# Patient Record
Sex: Female | Born: 1990 | Hispanic: Yes | Smoking: Never smoker
Health system: Southern US, Community
[De-identification: ages and names within clinical notes are randomized; demographics above are authoritative.]

## PROBLEM LIST (undated history)

## (undated) ENCOUNTER — Inpatient Hospital Stay: Payer: Self-pay

## (undated) DIAGNOSIS — I10 Essential (primary) hypertension: Secondary | ICD-10-CM

## (undated) DIAGNOSIS — N611 Abscess of the breast and nipple: Secondary | ICD-10-CM

## (undated) HISTORY — DX: Abscess of the breast and nipple: N61.1

## (undated) HISTORY — DX: Essential (primary) hypertension: I10

---
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
## 2012-06-21 ENCOUNTER — Ambulatory Visit: Payer: Self-pay | Admitting: Physician Assistant

## 2012-08-01 ENCOUNTER — Observation Stay: Payer: Self-pay | Admitting: Obstetrics and Gynecology

## 2012-08-01 LAB — URINALYSIS, COMPLETE
Bilirubin,UR: NEGATIVE
Glucose,UR: NEGATIVE mg/dL (ref 0–75)
Glucose,UR: NEGATIVE mg/dL (ref 0–75)
Ketone: NEGATIVE
Nitrite: NEGATIVE
Nitrite: NEGATIVE
Ph: 7 (ref 4.5–8.0)
Protein: NEGATIVE
Protein: NEGATIVE
Specific Gravity: 1.01 (ref 1.003–1.030)
Squamous Epithelial: 14
Squamous Epithelial: 11
WBC UR: 5 /HPF (ref 0–5)

## 2012-08-01 LAB — CBC
HGB: 10.5 g/dL — ABNORMAL LOW (ref 12.0–16.0)
Platelet: 261 10*3/uL (ref 150–440)
RBC: 3.38 10*6/uL — ABNORMAL LOW (ref 3.80–5.20)

## 2012-08-02 LAB — URINE CULTURE

## 2012-08-08 ENCOUNTER — Emergency Department: Payer: Self-pay | Admitting: Emergency Medicine

## 2012-08-28 ENCOUNTER — Observation Stay: Payer: Self-pay | Admitting: Obstetrics and Gynecology

## 2012-10-16 ENCOUNTER — Inpatient Hospital Stay: Payer: Self-pay | Admitting: Obstetrics and Gynecology

## 2012-10-16 LAB — URINALYSIS, COMPLETE
Blood: NEGATIVE
Ketone: NEGATIVE
Ph: 7 (ref 4.5–8.0)
Protein: >=500
Squamous Epithelial: 51
WBC UR: 19 /HPF (ref 0–5)

## 2012-10-16 LAB — PIH PROFILE
BUN: 10 mg/dL (ref 7–18)
Calcium, Total: 8.2 mg/dL — ABNORMAL LOW (ref 8.5–10.1)
Chloride: 106 mmol/L (ref 98–107)
Co2: 24 mmol/L (ref 21–32)
Creatinine: 0.68 mg/dL (ref 0.60–1.30)
HGB: 12 g/dL (ref 12.0–16.0)
MCH: 32.8 pg (ref 26.0–34.0)
MCV: 93 fL (ref 80–100)
Osmolality: 270 (ref 275–301)
Platelet: 155 10*3/uL (ref 150–440)
RDW: 13.1 % (ref 11.5–14.5)
SGOT(AST): 21 U/L (ref 15–37)
WBC: 7.5 10*3/uL (ref 3.6–11.0)

## 2012-10-16 LAB — PROTEIN / CREATININE RATIO, URINE
Creatinine, Urine: 69.2 mg/dL (ref 30.0–125.0)
Protein/Creat. Ratio: 3584 mg/gCREAT — ABNORMAL HIGH (ref 0–200)

## 2012-10-18 LAB — URINE CULTURE

## 2012-10-18 LAB — BETA STREP CULTURE(ARMC)

## 2012-10-19 LAB — HEMATOCRIT: HCT: 29.8 % — ABNORMAL LOW (ref 35.0–47.0)

## 2012-11-27 ENCOUNTER — Emergency Department: Payer: Self-pay | Admitting: Emergency Medicine

## 2012-11-27 LAB — CBC WITH DIFFERENTIAL/PLATELET
Basophil #: 0.1 10*3/uL (ref 0.0–0.1)
Eosinophil #: 0 10*3/uL (ref 0.0–0.7)
HCT: 37.4 % (ref 35.0–47.0)
HGB: 13.2 g/dL (ref 12.0–16.0)
Lymphocyte #: 1.1 10*3/uL (ref 1.0–3.6)
Lymphocyte %: 10.3 %
MCH: 32.1 pg (ref 26.0–34.0)
Neutrophil #: 8.9 10*3/uL — ABNORMAL HIGH (ref 1.4–6.5)
Neutrophil %: 84.9 %
Platelet: 304 10*3/uL (ref 150–440)
RBC: 4.13 10*6/uL (ref 3.80–5.20)
RDW: 12.5 % (ref 11.5–14.5)

## 2012-11-27 LAB — URINALYSIS, COMPLETE
Glucose,UR: NEGATIVE mg/dL (ref 0–75)
Nitrite: NEGATIVE
RBC,UR: 1 /HPF (ref 0–5)
Specific Gravity: 1.027 (ref 1.003–1.030)
Squamous Epithelial: 54
WBC UR: 7 /HPF (ref 0–5)

## 2012-11-27 LAB — COMPREHENSIVE METABOLIC PANEL
Alkaline Phosphatase: 114 U/L (ref 50–136)
BUN: 15 mg/dL (ref 7–18)
Calcium, Total: 8.8 mg/dL (ref 8.5–10.1)
Co2: 26 mmol/L (ref 21–32)
Creatinine: 0.62 mg/dL (ref 0.60–1.30)
EGFR (Non-African Amer.): >60
Osmolality: 283 (ref 275–301)
Potassium: 3.7 mmol/L (ref 3.5–5.1)
SGOT(AST): 27 U/L (ref 15–37)
Sodium: 139 mmol/L (ref 136–145)
Total Protein: 7.4 g/dL (ref 6.4–8.2)

## 2013-07-27 HISTORY — PX: OTHER SURGICAL HISTORY: SHX169

## 2013-09-10 ENCOUNTER — Emergency Department: Payer: Self-pay

## 2013-11-01 ENCOUNTER — Ambulatory Visit: Payer: Self-pay | Admitting: Surgery

## 2014-01-22 ENCOUNTER — Emergency Department: Payer: Self-pay | Admitting: Emergency Medicine

## 2014-01-22 LAB — URINALYSIS, COMPLETE
Bacteria: NONE SEEN
Bilirubin,UR: NEGATIVE
Blood: NEGATIVE
GLUCOSE, UR: NEGATIVE mg/dL (ref 0–75)
LEUKOCYTE ESTERASE: NEGATIVE
NITRITE: NEGATIVE
PH: 6 (ref 4.5–8.0)
PROTEIN: NEGATIVE
RBC,UR: <1 /HPF (ref 0–5)
Specific Gravity: 1.027 (ref 1.003–1.030)
Squamous Epithelial: 2

## 2014-01-22 LAB — COMPREHENSIVE METABOLIC PANEL
ALK PHOS: 139 U/L — AB
Albumin: 3.9 g/dL (ref 3.4–5.0)
Anion Gap: 6 — ABNORMAL LOW (ref 7–16)
BUN: 13 mg/dL (ref 7–18)
Bilirubin,Total: 0.7 mg/dL (ref 0.2–1.0)
CO2: 26 mmol/L (ref 21–32)
Calcium, Total: 8.4 mg/dL — ABNORMAL LOW (ref 8.5–10.1)
Chloride: 104 mmol/L (ref 98–107)
Creatinine: 0.75 mg/dL (ref 0.60–1.30)
GLUCOSE: 87 mg/dL (ref 65–99)
Osmolality: 271 (ref 275–301)
Potassium: 3.8 mmol/L (ref 3.5–5.1)
SGOT(AST): 24 U/L (ref 15–37)
SGPT (ALT): 24 U/L (ref 12–78)
Sodium: 136 mmol/L (ref 136–145)
Total Protein: 7.4 g/dL (ref 6.4–8.2)

## 2014-01-22 LAB — CBC WITH DIFFERENTIAL/PLATELET
BASOS PCT: 0.3 %
Basophil #: 0 10*3/uL (ref 0.0–0.1)
Eosinophil #: 0.1 10*3/uL (ref 0.0–0.7)
Eosinophil %: 0.6 %
HCT: 41.3 % (ref 35.0–47.0)
HGB: 13.7 g/dL (ref 12.0–16.0)
Lymphocyte #: 1.2 10*3/uL (ref 1.0–3.6)
Lymphocyte %: 8.7 %
MCH: 29.9 pg (ref 26.0–34.0)
MCHC: 33.2 g/dL (ref 32.0–36.0)
MCV: 90 fL (ref 80–100)
MONO ABS: 0.7 x10 3/mm (ref 0.2–0.9)
Monocyte %: 5.3 %
Neutrophil #: 11.7 10*3/uL — ABNORMAL HIGH (ref 1.4–6.5)
Neutrophil %: 85.1 %
Platelet: 324 10*3/uL (ref 150–440)
RBC: 4.59 10*6/uL (ref 3.80–5.20)
RDW: 12.9 % (ref 11.5–14.5)
WBC: 13.7 10*3/uL — ABNORMAL HIGH (ref 3.6–11.0)

## 2014-01-22 LAB — PREGNANCY, URINE: PREGNANCY TEST, URINE: NEGATIVE m[IU]/mL

## 2014-01-22 LAB — LIPASE, BLOOD: Lipase: 129 U/L (ref 73–393)
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
## 2015-08-29 ENCOUNTER — Encounter: Payer: Self-pay | Admitting: Emergency Medicine

## 2015-08-29 ENCOUNTER — Emergency Department
Admission: EM | Admit: 2015-08-29 | Discharge: 2015-08-29 | Disposition: A | Discharge status: Home / Self Care | Payer: Medicaid Other | Attending: Emergency Medicine | Admitting: Emergency Medicine

## 2015-08-29 DIAGNOSIS — N61 Mastitis without abscess: Secondary | ICD-10-CM | POA: Diagnosis not present

## 2015-08-29 DIAGNOSIS — R509 Fever, unspecified: Secondary | ICD-10-CM | POA: Diagnosis present

## 2015-08-29 LAB — CBC WITH DIFFERENTIAL/PLATELET
Basophils Absolute: 0 10*3/uL (ref 0–0.1)
Basophils Relative: 0 %
EOS ABS: 0 10*3/uL (ref 0–0.7)
Eosinophils Relative: 0 %
HEMATOCRIT: 39.2 % (ref 35.0–47.0)
HEMOGLOBIN: 13.4 g/dL (ref 12.0–16.0)
LYMPHS ABS: 1.2 10*3/uL (ref 1.0–3.6)
Lymphocytes Relative: 7 %
MCH: 30.1 pg (ref 26.0–34.0)
MCHC: 34.3 g/dL (ref 32.0–36.0)
MCV: 87.7 fL (ref 80.0–100.0)
MONOS PCT: 6 %
Monocytes Absolute: 1 10*3/uL — ABNORMAL HIGH (ref 0.2–0.9)
NEUTROS PCT: 87 %
Neutro Abs: 15.6 10*3/uL — ABNORMAL HIGH (ref 1.4–6.5)
PLATELETS: 326 10*3/uL (ref 150–440)
RBC: 4.47 MIL/uL (ref 3.80–5.20)
RDW: 13.3 % (ref 11.5–14.5)
WBC: 18 10*3/uL — ABNORMAL HIGH (ref 3.6–11.0)

## 2015-08-29 LAB — BASIC METABOLIC PANEL
Anion gap: 7 (ref 5–15)
BUN: 11 mg/dL (ref 6–20)
CHLORIDE: 102 mmol/L (ref 101–111)
CO2: 27 mmol/L (ref 22–32)
CREATININE: 0.62 mg/dL (ref 0.44–1.00)
Calcium: 8.9 mg/dL (ref 8.9–10.3)
GFR calc non Af Amer: >60 mL/min (ref >60)
GLUCOSE: 99 mg/dL (ref 65–99)
Potassium: 3.8 mmol/L (ref 3.5–5.1)
Sodium: 136 mmol/L (ref 135–145)

## 2015-08-29 MED ORDER — CLINDAMYCIN PHOSPHATE 600 MG/50ML IV SOLN
600.0000 mg | Freq: Once | INTRAVENOUS | Status: AC
  Administered 2015-08-29: 600 mg via INTRAVENOUS
  Filled 2015-08-29: qty 50

## 2015-08-29 MED ORDER — KETOROLAC TROMETHAMINE 30 MG/ML IJ SOLN
30.0000 mg | Freq: Once | INTRAMUSCULAR | Status: AC
  Administered 2015-08-29: 30 mg via INTRAVENOUS
  Filled 2015-08-29: qty 1

## 2015-08-29 MED ORDER — CLINDAMYCIN HCL 150 MG PO CAPS: 150.0000 mg | ORAL_CAPSULE | Freq: Four times a day (QID) | ORAL | Status: DC

## 2015-08-29 MED ORDER — IBUPROFEN 800 MG PO TABS: 800.0000 mg | ORAL_TABLET | Freq: Three times a day (TID) | ORAL | Status: DC | PRN

## 2015-08-29 MED ORDER — TRAMADOL HCL 50 MG PO TABS: 50.0000 mg | ORAL_TABLET | Freq: Four times a day (QID) | ORAL | Status: DC | PRN

## 2015-08-29 MED ORDER — SODIUM CHLORIDE 0.9 % IV BOLUS (SEPSIS)
1000.0000 mL | Freq: Once | INTRAVENOUS | Status: AC
  Administered 2015-08-29: 1000 mL via INTRAVENOUS

## 2015-08-29 NOTE — ED Notes (Signed)
Body aches   Fever and possible abscess to left chest/arm

## 2015-08-29 NOTE — ED Provider Notes (Signed)
[facility] Emergency Department Provider Note  ____________________________________________  Time seen: Approximately 4:10 PM  I have reviewed the triage vital signs and the nursing notes.   HISTORY  Chief Complaint Fever    HPI Kendra Santos is a 25 y.o. female patient complaining of left breast pain and a nodule lesion that she noticed the last 3 days. Patient denies any erythema and discharge.  Denies nipple discharge. She also complaining of fever and body aches. Patient denies any URI signs symptoms. Patient denies any nausea vomiting diarrhea. No palliative measures taken for this complaint. Patient rates the pain as a 6/10.   History reviewed. No pertinent past medical history.  There are no active problems to display for this patient.   History reviewed. No pertinent past surgical history.  Current Outpatient Rx  Name  Route  Sig  Dispense  Refill  . clindamycin (CLEOCIN) 150 MG capsule   Oral   Take 1 capsule (150 mg total) by mouth 4 (four) times daily.   40 capsule   0   . ibuprofen (ADVIL,MOTRIN) 800 MG tablet   Oral   Take 1 tablet (800 mg total) by mouth every 8 (eight) hours as needed.   30 tablet   0   . traMADol (ULTRAM) 50 MG tablet   Oral   Take 1 tablet (50 mg total) by mouth every 6 (six) hours as needed.   20 tablet   0     Allergies Review of patient's allergies indicates no known allergies.  No family history on file.  Social History Social History  Substance Use Topics  . Smoking status: Never Smoker   . Smokeless tobacco: None  . Alcohol Use: No    Review of Systems Constitutional: Fever  Eyes: No visual changes. ENT: No sore throat. Cardiovascular: Denies chest pain.  Respiratory: Denies shortness of breath. Gastrointestinal: No abdominal pain.  No nausea, no vomiting.  No diarrhea.  No constipation. Genitourinary: Negative for dysuria. Musculoskeletal: Negative for back pain. Skin:  Negative for rash. Left breast pain Neurological: Negative for headaches, focal weakness or numbness. 10-point ROS otherwise negative.  ____________________________________________   PHYSICAL EXAM:  VITAL SIGNS: ED Triage Vitals  Enc Vitals Group     BP --      Pulse --      Resp --      Temp --      Temp src --      SpO2 --      Weight --      Height --      Head Cir --      Peak Flow --      Pain Score 08/29/15 1600 6     Pain Loc --      Pain Edu? --      Excl. in GC? --     Constitutional: Alert and oriented. Well appearing and in no acute distress. Eyes: Conjunctivae are normal. PERRL. EOMI. Head: Atraumatic. Nose: No congestion/rhinnorhea. Mouth/Throat: Mucous membranes are moist.  Oropharynx non-erythematous. Neck: No stridor.  No cervical spine tenderness to palpation. Hematological/Lymphatic/Immunilogical: No cervical lymphadenopathy. Cardiovascular: Normal rate, regular rhythm. Grossly normal heart sounds.  Good peripheral circulation. Tachycardic Respiratory: Normal respiratory effort.  No retractions. Lungs CTAB. Gastrointestinal: Soft and nontender. No distention. No abdominal bruits. No CVA tenderness. Musculoskeletal: No lower extremity tenderness nor edema.  No joint effusions. Neurologic:  Normal speech and language. No gross focal neurologic deficits are appreciated. No gait instability. Skin:  Skin is warm, dry and intact. No rash noted. Palpable non-erythematous nodule lesion left lateral breast. No nipple Discharge. No dimpling of breast. Moderate guarding with palpation. Psychiatric: Mood and affect are normal. Speech and behavior are normal.  ____________________________________________   LABS (all labs ordered are listed, but only abnormal results are displayed)  Labs Reviewed  CBC WITH DIFFERENTIAL/PLATELET - Abnormal; Notable for the following:    WBC 18.0 (*)    Neutro Abs 15.6 (*)    Monocytes Absolute 1.0 (*)    All other components  within normal limits  BASIC METABOLIC PANEL   ____________________________________________  EKG   ____________________________________________  RADIOLOGY   ____________________________________________   PROCEDURES  Procedure(s) performed: None  Critical Care performed: No  ____________________________________________   INITIAL IMPRESSION / ASSESSMENT AND PLAN / ED COURSE  Pertinent labs & imaging results that were available during my care of the patient were reviewed by me and considered in my medical decision making (see chart for details).  Mastitis left breast. Patient given IV clindamycin, Toradol, 1,000 cc normal saline. Patient discharge with prescription for tramadol, ibuprofen, and clindamycin. Patient given a work note for 3 days. She [facility] no improvement in 2-3 days. ____________________________________________   FINAL CLINICAL IMPRESSION(S) / ED DIAGNOSES  Final diagnoses:  Acute mastitis of left breast      Joni Reining, PA-C 08/29/15 1704  Joni Reining, PA-C 08/29/15 1744  Minna Antis, MD 08/30/15 2139

## 2015-08-29 NOTE — ED Notes (Addendum)
PA at bedside. Pt complains of knot to left breast that she noticed 3 days ago. Pt complains of fever. Knot is palpable upon assessment per PA but no redness or swelling noted to the site.

## 2015-09-18 ENCOUNTER — Other Ambulatory Visit: Payer: Self-pay | Admitting: Family Medicine

## 2015-09-19 ENCOUNTER — Other Ambulatory Visit: Payer: Self-pay | Admitting: Family Medicine

## 2015-09-19 DIAGNOSIS — N61 Mastitis without abscess: Secondary | ICD-10-CM

## 2015-09-20 ENCOUNTER — Ambulatory Visit
Admission: RE | Admit: 2015-09-20 | Discharge: 2015-09-20 | Disposition: A | Discharge status: Home / Self Care | Payer: Medicaid Other | Source: Ambulatory Visit | Attending: Family Medicine | Admitting: Family Medicine

## 2015-09-20 ENCOUNTER — Emergency Department
Admission: EM | Admit: 2015-09-20 | Discharge: 2015-09-20 | Disposition: A | Discharge status: Home / Self Care | Payer: Medicaid Other | Attending: Emergency Medicine | Admitting: Emergency Medicine

## 2015-09-20 ENCOUNTER — Encounter: Payer: Self-pay | Admitting: Emergency Medicine

## 2015-09-20 DIAGNOSIS — N61 Mastitis without abscess: Secondary | ICD-10-CM | POA: Diagnosis not present

## 2015-09-20 DIAGNOSIS — Z792 Long term (current) use of antibiotics: Secondary | ICD-10-CM | POA: Insufficient documentation

## 2015-09-20 DIAGNOSIS — N611 Abscess of the breast and nipple: Secondary | ICD-10-CM | POA: Insufficient documentation

## 2015-09-20 DIAGNOSIS — Z3202 Encounter for pregnancy test, result negative: Secondary | ICD-10-CM | POA: Insufficient documentation

## 2015-09-20 DIAGNOSIS — N644 Mastodynia: Secondary | ICD-10-CM | POA: Diagnosis present

## 2015-09-20 DIAGNOSIS — L0291 Cutaneous abscess, unspecified: Secondary | ICD-10-CM

## 2015-09-20 LAB — CBC WITH DIFFERENTIAL/PLATELET
BASOS ABS: 0.1 10*3/uL (ref 0–0.1)
BASOS PCT: 0 %
EOS ABS: 0.1 10*3/uL (ref 0–0.7)
Eosinophils Relative: 1 %
HEMATOCRIT: 42.1 % (ref 35.0–47.0)
HEMOGLOBIN: 14.2 g/dL (ref 12.0–16.0)
Lymphocytes Relative: 9 %
Lymphs Abs: 1.3 10*3/uL (ref 1.0–3.6)
MCH: 29.7 pg (ref 26.0–34.0)
MCHC: 33.8 g/dL (ref 32.0–36.0)
MCV: 87.6 fL (ref 80.0–100.0)
Monocytes Absolute: 1 10*3/uL — ABNORMAL HIGH (ref 0.2–0.9)
Monocytes Relative: 7 %
NEUTROS ABS: 12.5 10*3/uL — AB (ref 1.4–6.5)
NEUTROS PCT: 83 %
Platelets: 468 10*3/uL — ABNORMAL HIGH (ref 150–440)
RBC: 4.8 MIL/uL (ref 3.80–5.20)
RDW: 12.7 % (ref 11.5–14.5)
WBC: 14.8 10*3/uL — AB (ref 3.6–11.0)

## 2015-09-20 LAB — BASIC METABOLIC PANEL
Anion gap: 8 (ref 5–15)
BUN: 10 mg/dL (ref 6–20)
CHLORIDE: 101 mmol/L (ref 101–111)
CO2: 27 mmol/L (ref 22–32)
CREATININE: 0.73 mg/dL (ref 0.44–1.00)
Calcium: 9 mg/dL (ref 8.9–10.3)
GFR calc Af Amer: >60 mL/min (ref >60)
GFR calc non Af Amer: >60 mL/min (ref >60)
GLUCOSE: 101 mg/dL — AB (ref 65–99)
Potassium: 3.6 mmol/L (ref 3.5–5.1)
SODIUM: 136 mmol/L (ref 135–145)

## 2015-09-20 LAB — POCT PREGNANCY, URINE: Preg Test, Ur: NEGATIVE

## 2015-09-20 MED ORDER — LIDOCAINE HCL (PF) 1 % IJ SOLN
INTRAMUSCULAR | Status: AC
  Filled 2015-09-20: qty 20

## 2015-09-20 MED ORDER — ONDANSETRON HCL 4 MG/2ML IJ SOLN
4.0000 mg | Freq: Once | INTRAMUSCULAR | Status: AC
  Administered 2015-09-20: 4 mg via INTRAVENOUS

## 2015-09-20 MED ORDER — SODIUM CHLORIDE 0.9 % IV BOLUS (SEPSIS)
1000.0000 mL | Freq: Once | INTRAVENOUS | Status: AC
  Administered 2015-09-20: 1000 mL via INTRAVENOUS

## 2015-09-20 MED ORDER — VANCOMYCIN HCL IN DEXTROSE 1-5 GM/200ML-% IV SOLN
1000.0000 mg | Freq: Once | INTRAVENOUS | Status: AC
  Administered 2015-09-20: 1000 mg via INTRAVENOUS
  Filled 2015-09-20: qty 200

## 2015-09-20 MED ORDER — MORPHINE SULFATE (PF) 4 MG/ML IV SOLN
4.0000 mg | Freq: Once | INTRAVENOUS | Status: AC
  Administered 2015-09-20: 4 mg via INTRAVENOUS

## 2015-09-20 MED ORDER — VANCOMYCIN HCL IN DEXTROSE 1-5 GM/200ML-% IV SOLN
INTRAVENOUS | Status: AC
  Administered 2015-09-20: 1000 mg via INTRAVENOUS
  Filled 2015-09-20: qty 200

## 2015-09-20 MED ORDER — OXYCODONE-ACETAMINOPHEN 5-325 MG PO TABS: 1.0000 | ORAL_TABLET | ORAL | Status: DC | PRN

## 2015-09-20 MED ORDER — SULFAMETHOXAZOLE-TRIMETHOPRIM 800-160 MG PO TABS: 1.0000 | ORAL_TABLET | Freq: Two times a day (BID) | ORAL | Status: DC

## 2015-09-20 NOTE — ED Notes (Addendum)
Pt c/o left breast pain x86month, site is swollen and red. No drainage noted. Pt states she had fever x2days ago

## 2015-09-20 NOTE — ED Notes (Signed)
AAOx3.  Skin warm and dry.  NAD.  D/C home 

## 2015-09-20 NOTE — Discharge Instructions (Signed)
Please seek medical attention for any high fevers, chest pain, shortness of breath, change in behavior, persistent vomiting, bloody stool or any other new or concerning symptoms.   Absceso (Abscess)  Un absceso es una zona infectada que contiene pus y desechos.Puede aparecer en cualquier parte del cuerpo. Tambin se lo conoce como fornculo o divieso. CAUSAS  Ocurre cuando los tejidos se infectan. Tambin puede formarse por obstruccin de las glndulas sebceas o las glndulas sudorparas, infeccin de los folculos pilosos o por una lesin pequea en la piel. A medida que el organismo lucha contra la infeccin, se acumula pus en la zona y hace presin debajo de la piel. Esta presin causa dolor. Las personas con un sistema inmunolgico debilitado tienen dificultad para Industrial/product designer las infecciones y pueden formar abscesos con ms frecuencia.  SNTOMAS  Generalmente un absceso se forma sobre la piel y se vuelve una masa dolorosa, roja, caliente y sensible. Si se forma debajo de la piel, podr sentir como una zona blanda, que se [place], debajo de la piel. Algunos abscesos se abren (ruptura) por s mismos, pero la mayora seguir empeorando si no se lo trata. La infeccin puede diseminarse hacia otros sitios del cuerpo y finalmente al torrente sanguneo y hace que el enfermo se sienta mal.  DIAGNSTICO  El mdico le har una historia clnica y un examen fsico. Podrn tomarle Lauris Poag de lquido del absceso y Public librarian para Clinical research associate la causa de la infeccin. .  TRATAMIENTO  El mdico le indicar antibiticos para combatir la infeccin. Sin embargo, el uso de antibiticos solamente no curar el absceso. El mdico tendr que hacer un pequeo corte (incisin) en el absceso para drenar el pus. En algunos casos se introduce una gasa en el absceso para reducir Chief Technology Officer y que siga drenando la zona.  INSTRUCCIONES PARA EL CUIDADO EN EL HOGAR   Solo tome medicamentos de venta libre o recetados para Water quality scientist, Dentist o fiebre, segn las indicaciones del mdico.  Si le han recetado antibiticos, tmelos segn las indicaciones. Tmelos todos, aunque se sienta mejor.  Si le aplicaron una gasa, siga las indicaciones del mdico para [place].  Para evitar la propagacin de la infeccin:  Mantenga el absceso cubierto con el vendaje.  Lvese bien las manos.  No comparta artculos de cuidado personal, toallas o jacuzzis con los dems.  Evite el contacto con la piel de Economist.  Mantenga la piel y la ropa limpia alrededor del absceso.  Cumpla con todas las visitas de control, segn le indique su mdico. SOLICITE ATENCIN MDICA SI:   Aumenta el dolor, la hinchazn, el enrojecimiento, drena lquido o sangra.  Siente dolores musculares, escalofros, o una sensacin general de Dentist.  Tiene fiebre. ASEGRESE DE QUE:   Comprende estas instrucciones.  Controlar su enfermedad.  Solicitar ayuda de inmediato si no mejora o si empeora.   Esta informacin no tiene Theme park manager el consejo del mdico. Asegrese de hacerle al mdico cualquier pregunta que tenga.   Document Released: 07/13/2005 Document Revised: 01/12/2012 Elsevier Interactive Patient Education [employer]

## 2015-09-20 NOTE — H&P (Signed)
Kendra Santos is an 25 y.o. female.   Chief Complaint: Left breast abscess HPI: 25 yr old female with breast pain 2 weeks. Patient states that she was started on some antibiotics by her primary care doctor and then it was recently changed she also had ultrasound of the breast. Patient states that it just started some pain and then gradually had some redness and swelling in the area. Patient has not noticed any drainage from the breast. Patient has no other history of any breast complaints previously or any skin abscesses previously. Patient denies having anyone in the family with other skin abscesses. Patient denies any family history of breast disease or breast cancer.  Patient denies any fever chills nausea vomiting abdominal pain or dysuria.  PMHx: Hypertension  PSHx: skin lesion removed from back, unsure of pathology  Famhx: mother: hypertension and diabetes  Social History:  reports that she has never smoked. She does not have any smokeless tobacco history on file. She reports that she does not drink alcohol. Her drug history is not on file.  Allergies: No Known Allergies   (Not in a hospital admission)  Results for orders placed or performed during the hospital encounter of 09/20/15 (from the past 48 hour(s))  CBC with Differential     Status: Abnormal   Collection Time: 09/20/15  5:50 PM  Result Value Ref Range   WBC 14.8 (H) 3.6 - 11.0 K/uL   RBC 4.80 3.80 - 5.20 MIL/uL   Hemoglobin 14.2 12.0 - 16.0 g/dL   HCT 42.1 35.0 - 47.0 %   MCV 87.6 80.0 - 100.0 fL   MCH 29.7 26.0 - 34.0 pg   MCHC 33.8 32.0 - 36.0 g/dL   RDW 12.7 11.5 - 14.5 %   Platelets 468 (H) 150 - 440 K/uL   Neutrophils Relative % 83 %   Neutro Abs 12.5 (H) 1.4 - 6.5 K/uL   Lymphocytes Relative 9 %   Lymphs Abs 1.3 1.0 - 3.6 K/uL   Monocytes Relative 7 %   Monocytes Absolute 1.0 (H) 0.2 - 0.9 K/uL   Eosinophils Relative 1 %   Eosinophils Absolute 0.1 0 - 0.7 K/uL   Basophils Relative 0 %   Basophils  Absolute 0.1 0 - 0.1 K/uL  Basic metabolic panel     Status: Abnormal   Collection Time: 09/20/15  5:50 PM  Result Value Ref Range   Sodium 136 135 - 145 mmol/L   Potassium 3.6 3.5 - 5.1 mmol/L   Chloride 101 101 - 111 mmol/L   CO2 27 22 - 32 mmol/L   Glucose, Bld 101 (H) 65 - 99 mg/dL   BUN 10 6 - 20 mg/dL   Creatinine, Ser 0.73 0.44 - 1.00 mg/dL   Calcium 9.0 8.9 - 10.3 mg/dL   GFR calc non Af Amer >60 >60 mL/min   GFR calc Af Amer >60 >60 mL/min    Comment: (NOTE) The eGFR has been calculated using the CKD EPI equation. This calculation has not been validated in all clinical situations. eGFR's persistently <60 mL/min signify possible Chronic Kidney Disease.    Anion gap 8 5 - 15  Pregnancy, urine POC     Status: None   Collection Time: 09/20/15  6:12 PM  Result Value Ref Range   Preg Test, Ur NEGATIVE NEGATIVE    Comment:        THE SENSITIVITY OF THIS METHODOLOGY IS >24 mIU/mL    US Breast Ltd Uni Left Inc Axilla  09/20/2015  CLINICAL DATA:  25 year old female with increasing left breast swelling, pain and redness. Patient is now on second course of antibiotics without improvement. EXAM: ULTRASOUND OF THE LEFT BREAST COMPARISON:  None FINDINGS: On physical exam, the lateral left breast is enlarged hard and air at edematous. Targeted ultrasound is performed, showing diffuse skin thickening and parenchymal edema. There are multiple ill-defined areas of complex fluid throughout the lateral left breast compatible with infected fluid/abscesses. These areas include a 2.9 x 1.1 cm superficially at the 3 o'clock position 5 cm from the nipple, a 2.1 x 0.6 cm collection mid depth at the 2 o'clock position 5 cm from the nipple and a 1.6 x 1.1 cm collection in the mid -posterior depth 2 o'clock position 5 cm from the nipple. IMPRESSION: Left breast mastitis with multiple ill-defined areas of complex fluid throughout the lateral left breast compatible with infected fluid/abscesses as described  above. RECOMMENDATION: Clinical/surgery follow-up. These results were discussed with Marcina Millard, PA, on 09/20/2015 at 2:25 p.m., who instructed the patient to seek further treatment in the emergency room. I have discussed the findings and recommendations with the patient. Results were also provided in writing at the conclusion of the visit. If applicable, a reminder letter will be sent to the patient regarding the next appointment. BI-RADS CATEGORY  2: Benign Finding(s) Electronically Signed   By: Margarette Canada M.D.   On: 09/20/2015 14:36    Review of Systems  Constitutional: Positive for malaise/fatigue. Negative for fever and chills.  HENT: Negative for sore throat.   Respiratory: Negative for cough, shortness of breath and wheezing.   Cardiovascular: Negative for chest pain and leg swelling.  Gastrointestinal: Positive for diarrhea. Negative for nausea, vomiting, abdominal pain and blood in stool.  Genitourinary: Negative for dysuria, urgency and hematuria.  Musculoskeletal: Negative for myalgias and joint pain.  Skin: Negative for itching and rash.  Neurological: Negative for dizziness, loss of consciousness, weakness and headaches.  Psychiatric/Behavioral: Negative for depression. The patient is not nervous/anxious.   All other systems reviewed and are negative.   Blood pressure 113/75, pulse 95, temperature 98.4 F (36.9 C), temperature source Oral, resp. rate 16, height _0  (1.676 m), weight 181 lb (82.101 kg), SpO2 98 %. Physical Exam  Vitals reviewed. Constitutional: She is oriented to person, place, and time. She appears well-developed and well-nourished. No distress.  HENT:  Head: Normocephalic and atraumatic.  Right Ear: External ear normal.  Left Ear: External ear normal.  Nose: Nose normal.  Mouth/Throat: Oropharynx is clear and moist.  Eyes: Conjunctivae and EOM are normal. Pupils are equal, round, and reactive to light. No scleral icterus.  Neck: Normal range of motion.  Neck supple. No tracheal deviation present.  Cardiovascular: Normal rate, regular rhythm, normal heart sounds and intact distal pulses.  Exam reveals no gallop and no friction rub.   No murmur heard. Respiratory: Breath sounds normal. No respiratory distress. She has no wheezes. She has no rales.  GI: Soft. She exhibits no distension. There is no tenderness. There is no rebound.  Musculoskeletal: Normal range of motion. She exhibits no edema.  Neurological: She is alert and oriented to person, place, and time. No cranial nerve deficit.  Skin: Skin is warm. No rash noted. There is erythema. No pallor.  Left breast with erythema and fluctuance at 3'oclock position in about 4cm area adjacent to nipple/aerolar complex  Psychiatric: She has a normal mood and affect. Her behavior is normal. Judgment and thought content normal.  Assessment/Plan 25 year old with a left breast abscess. I personally reviewed her past medical history with some hypertension noted however she is not followed up with this. I also personally reviewed her lab values showing an elevated white blood cell count. I personally reviewed her ultrasound images showing fluid collection resembling abscess in the left breast. I also reviewed the radiology read. I discussed this with the emergency room physician Dr. Archie Balboa and offered the patient aspiration of the area with ultrasound guidance versus excision of the area. Patient opted to try the aspiration. We were able to aspirate out about 30 mL of purulent drainage from the area. I discussed with the patient that we could send her home on oral antibiotics Bactrim for a week could admit her for IV antibiotics and observation versus incision and drainage if there was not improvement. Patient opted to go home on oral antibiotics we will have her return to clinic on Monday to ensure that there is improvement.  Hubbard Robinson, MD 09/20/2015, 9:47 PM

## 2015-09-20 NOTE — ED Notes (Signed)
Pt states that she was seen here on 2/2 for abscess to left breast and took a round of antibiotics. Pt states that the knot never went away and has developed a fever and redness around site. Pt complains of pain to area. Pt saw PCP who scheduled her for a mammogram and pt had that done PTA today. Pt was told to come here afterwards for follow up due to redness.

## 2015-09-20 NOTE — ED Provider Notes (Signed)
[facility] Emergency Department Provider Note   ____________________________________________  Time seen: ~1630  I have reviewed the triage vital signs and the nursing notes.   HISTORY  Chief Complaint Abscess   History limited by: Not Limited   HPI Kendra Santos is a 25 y.o. female who presents to the emergency department today because of concerns for left breast swelling, pain and abscesses. Patient was seen earlier this month in the emergency department for left breast swelling and pain. She was put on antibiotics at that time. She states she is continuing to have pain and swelling. She saw primary care doctor 2 days ago who ordered an ultrasound. She had a ultrasound done today which did show multiple abscesses in the left breast. Because of this she was instructed to come to the emergency department. The pain is severe. It is located in the left breast and is worse whenever she moves the arm.She denies any fevers.    History reviewed. No pertinent past medical history.  There are no active problems to display for this patient.   History reviewed. No pertinent past surgical history.  Current Outpatient Rx  Name  Route  Sig  Dispense  Refill  . clindamycin (CLEOCIN) 150 MG capsule   Oral   Take 1 capsule (150 mg total) by mouth 4 (four) times daily.   40 capsule   0   . ibuprofen (ADVIL,MOTRIN) 800 MG tablet   Oral   Take 1 tablet (800 mg total) by mouth every 8 (eight) hours as needed.   30 tablet   0   . traMADol (ULTRAM) 50 MG tablet   Oral   Take 1 tablet (50 mg total) by mouth every 6 (six) hours as needed.   20 tablet   0     Allergies Review of patient's allergies indicates no known allergies.  History reviewed. No pertinent family history.  Social History Social History  Substance Use Topics  . Smoking status: Never Smoker   . Smokeless tobacco: None  . Alcohol Use: No    Review of Systems  Constitutional:  Negative for fever. Cardiovascular: Negative for chest pain. Respiratory: Negative for shortness of breath. Gastrointestinal: Negative for abdominal pain, vomiting and diarrhea. Neurological: Negative for headaches, focal weakness or numbness.   10-point ROS otherwise negative.  ____________________________________________   PHYSICAL EXAM:  VITAL SIGNS: ED Triage Vitals  Enc Vitals Group     BP 09/20/15 1453 112/99 mmHg     Pulse Rate 09/20/15 1453 101     Resp 09/20/15 1453 18     Temp 09/20/15 1453 98.4 F (36.9 C)     Temp Source 09/20/15 1453 Oral     SpO2 09/20/15 1453 97 %     Weight 09/20/15 1453 181 lb (82.101 kg)     Height 09/20/15 1453  (1.676 m)     Head Cir --      Peak Flow --      Pain Score 09/20/15 1454 9   Constitutional: Alert and oriented. Well appearing and in no distress. Eyes: Conjunctivae are normal. PERRL. Normal extraocular movements. ENT   Head: Normocephalic and atraumatic.   Nose: No congestion/rhinnorhea.   Mouth/Throat: Mucous membranes are moist.   Neck: No stridor. Hematological/Lymphatic/Immunilogical: No cervical lymphadenopathy. Cardiovascular: Normal rate, regular rhythm.  No murmurs, rubs, or gallops. Respiratory: Normal respiratory effort without tachypnea nor retractions. Breath sounds are clear and equal bilaterally. No wheezes/rales/rhonchi. Gastrointestinal: Soft and nontender. No distention. There is  no CVA tenderness. Genitourinary: Deferred Musculoskeletal: Normal range of motion in all extremities. No joint effusions.  No lower extremity tenderness nor edema. Neurologic:  Normal speech and language. No gross focal neurologic deficits are appreciated.  Skin:  Redness swelling and erythema to the left breast. Worse laterally. Induration felt. Psychiatric: Mood and affect are normal. Speech and behavior are normal. Patient exhibits appropriate insight and  judgment.  ____________________________________________    LABS (pertinent positives/negatives)  Labs Reviewed  CBC WITH DIFFERENTIAL/PLATELET - Abnormal; Notable for the following:    WBC 14.8 (*)    Platelets 468 (*)    Neutro Abs 12.5 (*)    Monocytes Absolute 1.0 (*)    All other components within normal limits  BASIC METABOLIC PANEL - Abnormal; Notable for the following:    Glucose, Bld 101 (*)    All other components within normal limits  POCT PREGNANCY, URINE     ____________________________________________   EKG  None  ____________________________________________    RADIOLOGY  US breast  IMPRESSION: Left breast mastitis with multiple ill-defined areas of complex fluid throughout the lateral left breast compatible with infected fluid/abscesses as described above.  ____________________________________________   PROCEDURES  Procedure(s) performed: Aspiration, see procedure note(s).  Critical Care performed: No  Aspiration of Abscess Performed by: Phineas Semen Consent: Verbal consent obtained. Risks and benefits: risks, benefits and alternatives were discussed Type: abscess  Body area: left breast  Anesthesia: local infiltration  Aspiration with 18 ga needle  Local anesthetic: lidocaine 1% without epinephrine  Anesthetic total: 2 ml  Drainage: purulent  Drainage amount: small  Patient tolerance: Patient tolerated the procedure well with no immediate complications.    ____________________________________________   INITIAL IMPRESSION / ASSESSMENT AND PLAN / ED COURSE  Pertinent labs & imaging results that were available during my care of the patient were reviewed by me and considered in my medical decision making (see chart for details).  Patient presented to the emergency department today because of concerns for continued left breast swelling, pain and ultrasound which showed multiple small abscesses. Patient did have a mild  leukocytosis. The patient was seen by Dr. Orvis Brill of surgery. 2 together we attempted to aspirate the abscesses with a small amount of purulent drainage. Surgery did offer to admit the patient for IV antibiotics and possible low are either patient chose to try another round of oral antibiotics. Will plan on discharging with Bactrim. Additionally patient will follow-up with Dr. Orvis Brill in her office Monday morning.  ____________________________________________   FINAL CLINICAL IMPRESSION(S) / ED DIAGNOSES  Final diagnoses:  Abscess     Phineas Semen, MD 09/20/15 2057

## 2015-09-20 NOTE — ED Notes (Signed)
Pt called out for itching, and flushed face. Pt face is red, denies any SOB or swelling. MD notified. Rate slowed

## 2015-09-23 ENCOUNTER — Encounter: Payer: Self-pay | Admitting: Surgery

## 2015-09-23 ENCOUNTER — Ambulatory Visit (INDEPENDENT_AMBULATORY_CARE_PROVIDER_SITE_OTHER): Payer: Self-pay | Admitting: Surgery

## 2015-09-23 VITALS — BP 124/71 | HR 79 | Temp 97.8°F | Ht 66.0 in | Wt 181.0 lb

## 2015-09-23 DIAGNOSIS — N611 Abscess of the breast and nipple: Secondary | ICD-10-CM

## 2015-09-23 NOTE — Patient Instructions (Signed)
Continue your antibiotics until they are completely finished.  We will see you in the [place] office on Tuesday at 3pm. We are located in the [facility], 2nd floor, [address].  If you have any increased redness, increased drainage, or fever > 100.5; call our office and speak with a nurse immediately.

## 2015-09-23 NOTE — Progress Notes (Signed)
25yr old female with left breast abscess, s/p aspiration in ED on Friday 2/24 and changed to Bactrim for antibiotics.  Patient states that she is feeling better today.  She states that the redness and pain have improved.  SHe has had some minimal bloody drainage from the needle sites but able to keep a bandaid in place.  She states that she is having a lot of diarrhea but denies any fever, chills, nausea, vomiting or abdominal pain.   Filed Vitals:   09/23/15 0817  BP: 124/71  Pulse: 79  Temp: 97.8 F (36.6 C)   PE:  Gen: NAD  Left breast: 3 o'clock position abscess about 4cm in size induration, erythema much improved now, just minmal around puncture site with some sloughing of skin in the area, tenderness much improved, no fluctuance palpable now.  Res: no distress, CTAB/L Cardio: RRR Abd: soft, nd Ext: no edema  A/P:  Will have her continue the Bactrim, instructed to continue NSAIDs and warm compresses to the area as well.  She is to call if pain increases or redness worsens.  Will have her f/u in 1 week to ensure improvement in induration and wound

## 2015-10-01 ENCOUNTER — Ambulatory Visit (INDEPENDENT_AMBULATORY_CARE_PROVIDER_SITE_OTHER): Payer: Medicaid Other | Admitting: Surgery

## 2015-10-01 ENCOUNTER — Encounter: Payer: Self-pay | Admitting: Surgery

## 2015-10-01 ENCOUNTER — Telehealth: Payer: Self-pay

## 2015-10-01 VITALS — BP 118/81 | HR 108 | Temp 98.8°F | Ht 66.0 in | Wt 172.6 lb

## 2015-10-01 DIAGNOSIS — N611 Abscess of the breast and nipple: Secondary | ICD-10-CM

## 2015-10-01 MED ORDER — SULFAMETHOXAZOLE-TRIMETHOPRIM 800-160 MG PO TABS
1.0000 | ORAL_TABLET | Freq: Two times a day (BID) | ORAL | Status: DC
Start: 2015-10-01 — End: 2018-08-08

## 2015-10-01 NOTE — Patient Instructions (Signed)
Please heat your warm compress as hot as you can stand it and place on your breast 3 times daily, while you have the compress on your breast, you will then massage this area with moderate amount of pressure to help the drainage.   We will see you back in the office next Friday.  We will also place you back on the Bactrim and this has been sent to your pharmacy.

## 2015-10-01 NOTE — Progress Notes (Signed)
25 yr old female with left breast abscess, aspirated about 2 weeks prior now.  Patient stated that it stopped draining two days prior and was getting more red and painful.  Howevere while walking in the room it started draining again.  She has been doing the warm compresses twice daily. She is on the last day of Bactrim as well.   Filed Vitals:   10/01/15 1449  BP: 118/81  Pulse: 108  Temp: 98.8 F (37.1 C)   PE:  Gen: NAD Left Breast: Induration in 8cm area from nipple to lateral position, draining from previous aspiration site, able to express manually some purulence and necrotic material, with a residual area of about 4cm  A/P:  Will have her continue warm compresses TID, restart Bactrim and f/u in 1 week, to call if she feels it is getting worse

## 2015-10-02 NOTE — Telephone Encounter (Signed)
Patient's sister called in after patient left the office and states "My [facility] because you caused her so much pain." I explained that I was in the room with the Physician at the time of the visit and we talked her sister through the pain she was having but that due to the fact that we expressed some necrotic material and purulent drainage from opening in skin, that she would have some pain after her office visit. She states that her sister had told her, " I told them that I was in pain and they held me down against my will" I did explain that the patient did state that she was in pain on multiple occasions during visit, she was given multiple breaks and we coached her through her breathing as she was very anxious but never once did patient ask the physician to stop the procedure. She states, "My sister told me that she asked for pain meds and was told that she could not have any." I informed her that I was in the room with the Physician the entire time that she was in with the patient and that I did not hear a request for pain medication given. I explained that we would have gladly given her a refill should she have said something about this. The last portion of the call, she states that the reason that her sister is now upset more than anything is because "this was supposed to be a follow-up from the Emergency Room. She wasn't coming there to have anything done and she was not prepared for this." I explained that in our surgical practice that we see patients each day that come in for follow-ups from surgical procedures or from the Emergency Room and if there is something that needs to be done during their office visit, this is taken care of with permission of the patient so that they do not incur extra charges and so that they do not leave with an increased risk of infection or pain. The patient's sister, completely dismissed everything that I had to say on the phone. She also asked that I  place her sister with a different surgeon at her next visit. I did change her appointment with another surgeon as she had asked.

## 2015-10-11 ENCOUNTER — Ambulatory Visit: Payer: Self-pay | Admitting: Surgery

## 2015-10-17 ENCOUNTER — Ambulatory Visit: Payer: Self-pay | Admitting: Surgery

## 2016-09-20 ENCOUNTER — Emergency Department
Admission: EM | Admit: 2016-09-20 | Discharge: 2016-09-20 | Disposition: A | Discharge status: Home / Self Care | Payer: Medicaid Other | Attending: Emergency Medicine | Admitting: Emergency Medicine

## 2016-09-20 DIAGNOSIS — Z79899 Other long term (current) drug therapy: Secondary | ICD-10-CM | POA: Insufficient documentation

## 2016-09-20 DIAGNOSIS — J111 Influenza due to unidentified influenza virus with other respiratory manifestations: Secondary | ICD-10-CM | POA: Diagnosis not present

## 2016-09-20 DIAGNOSIS — I10 Essential (primary) hypertension: Secondary | ICD-10-CM | POA: Diagnosis not present

## 2016-09-20 DIAGNOSIS — R05 Cough: Secondary | ICD-10-CM | POA: Diagnosis present

## 2016-09-20 LAB — URINALYSIS, ROUTINE W REFLEX MICROSCOPIC
Bilirubin Urine: NEGATIVE
Glucose, UA: NEGATIVE mg/dL
Hgb urine dipstick: NEGATIVE
Ketones, ur: 5 mg/dL — AB
Leukocytes, UA: NEGATIVE
Nitrite: NEGATIVE
Protein, ur: NEGATIVE mg/dL
Specific Gravity, Urine: 1.025 (ref 1.005–1.030)
pH: 7 (ref 5.0–8.0)

## 2016-09-20 LAB — POCT PREGNANCY, URINE: PREG TEST UR: NEGATIVE

## 2016-09-20 MED ORDER — ACETAMINOPHEN 500 MG PO TABS
1000.0000 mg | ORAL_TABLET | Freq: Once | ORAL | Status: AC
  Administered 2016-09-20: 1000 mg via ORAL
  Filled 2016-09-20: qty 2

## 2016-09-20 MED ORDER — PSEUDOEPH-BROMPHEN-DM 30-2-10 MG/5ML PO SYRP: 5.0000 mL | ORAL_SOLUTION | Freq: Four times a day (QID) | ORAL | 0 refills | Status: DC | PRN

## 2016-09-20 MED ORDER — BENZONATATE 100 MG PO CAPS: 100.0000 mg | ORAL_CAPSULE | Freq: Three times a day (TID) | ORAL | 0 refills | Status: DC | PRN

## 2016-09-20 MED ORDER — FLUTICASONE PROPIONATE 50 MCG/ACT NA SUSP: 2.0000 | Freq: Every day | NASAL | 0 refills | Status: DC

## 2016-09-20 NOTE — ED Provider Notes (Signed)
[facility] Emergency Department Provider Note ____________________________________________  Time seen: 0922  I have reviewed the triage vital signs and the nursing notes.  HISTORY  Chief Complaint  Cough and Sore Throat  HPI Kendra Santos is a 26 y.o. female presents to the ED for evaluation of 3 days of sinus congestion, fevers, diarrhea, cough and earaches. She denies taking anything other than Nyquil for symptoms. She dose Tylenol twice yesterday. She did not receive the flu vaccine for the season.   Past Medical History:  Diagnosis Date  . Breast abscess   . Hypertension     Patient Active Problem List   Diagnosis Date Noted  . Left breast abscess     Past Surgical History:  Procedure Laterality Date  . sebaceous cyst excision  2015   Back    Prior to Admission medications   Medication Sig Start Date End Date Taking? Authorizing Provider  benzonatate (TESSALON PERLES) 100 MG capsule Take 1 capsule (100 mg total) by mouth 3 (three) times daily as needed for cough (Take 1-2 per dose). 09/20/16   Kensy Blizard V Bacon Treshawn Allen, PA-C  brompheniramine-pseudoephedrine-DM 30-2-10 MG/5ML syrup Take 5 mLs by mouth 4 (four) times daily as needed. 09/20/16   Kaela Beitz V Bacon Starlett Pehrson, PA-C  fluticasone (FLONASE) 50 MCG/ACT nasal spray Place 2 sprays into both nostrils daily. 09/20/16   Ireoluwa Grant V Bacon Javen Hinderliter, PA-C  sulfamethoxazole-trimethoprim (BACTRIM DS,SEPTRA DS) 800-160 MG tablet Take 1 tablet by mouth 2 (two) times daily. 10/01/15   Gladis Riffle, MD    Allergies Patient has no known allergies.  Family History  Problem Relation Age of Onset  . Diabetes Mother     Social History Social History  Substance Use Topics  . Smoking status: Never Smoker  . Smokeless tobacco: Never Used  . Alcohol use No    Review of Systems  Constitutional: Negative for fever. Eyes: Negative for visual changes. ENT: Negative for sore throat.  Cardiovascular:  Negative for chest pain. Respiratory: Negative for shortness of breath. Reports cough.  Gastrointestinal: Negative for abdominal pain, vomiting. Reports diarrhea. Genitourinary: Negative for dysuria. Musculoskeletal: Negative for back pain. Reports bodyaches Skin: Negative for rash. Neurological: Negative for headaches, focal weakness or numbness. ____________________________________________  PHYSICAL EXAM:  VITAL SIGNS: ED Triage Vitals  Enc Vitals Group     BP 09/20/16 0858 120/80     Pulse Rate 09/20/16 0858 (!) 104     Resp 09/20/16 0858 18     Temp 09/20/16 0858 (!) 101.7 F (38.7 C)     Temp Source 09/20/16 0858 Oral     SpO2 09/20/16 0858 97 %     Weight 09/20/16 0858 196 lb (88.9 kg)     Height 09/20/16 0858 5\' 3"  (1.6 m)     Head Circumference --      Peak Flow --      Pain Score 09/20/16 0902 8     Pain Loc --      Pain Edu? --      Excl. in GC? --    Constitutional: Alert and oriented. Well appearing and in no distress. Head: Normocephalic and atraumatic. Eyes: Conjunctivae are normal. PERRL. Normal extraocular movements Ears: Canals clear. TMs intact bilaterally. Nose: No congestion/rhinorrhea/epistaxis. Mouth/Throat: Mucous membranes are moist. Neck: Supple. No thyromegaly. Hematological/Lymphatic/Immunological: No cervical lymphadenopathy. Cardiovascular: Normal rate, regular rhythm. Normal distal pulses. Respiratory: Normal respiratory effort. No wheezes/rales/rhonchi. Gastrointestinal: Soft and nontender. No distention. Musculoskeletal: Nontender with normal range of motion in all  extremities.  Neurologic:  Normal gait without ataxia. Normal speech and language. No gross focal neurologic deficits are appreciated. Skin:  Skin is warm, dry and intact. No rash noted. ____________________________________________   LABS (pertinent positives/negatives) Labs Reviewed  URINALYSIS, ROUTINE W REFLEX MICROSCOPIC - Abnormal; Notable for the following:       Result  Value   Color, Urine YELLOW (*)    APPearance CLEAR (*)    Ketones, ur 5 (*)    All other components within normal limits  POC URINE PREG, ED  POCT PREGNANCY, URINE  ____________________________________________  PROCEDURES  Tylenol 1000 mg PO ____________________________________________  INITIAL IMPRESSION / ASSESSMENT AND PLAN / ED COURSE  Patient with clinical presentation consistent with influenza. She is discharged with a prescription for Tessalon Perles, Flonase, and Bromphed-DM for symptom relief. She will continue to monitor and treat fevers as needed. [facility].  ____________________________________________  FINAL CLINICAL IMPRESSION(S) / ED DIAGNOSES  Final diagnoses:  Influenza      Lissa HoardJenise V Bacon Madeline Bebout, PA-C 09/20/16 1115    Myrna Blazeravid Matthew Schaevitz, MD 09/20/16 [phone]

## 2016-09-20 NOTE — Discharge Instructions (Signed)
Your symptoms are consistent with influenza. You should dose medicines to treat symptoms as discussed. Take the prescription meds as directed. Rest, hydrate, and treat fevers with Tylenol and Motrin.

## 2016-09-20 NOTE — ED Triage Notes (Signed)
Pt c/o cough with sinus congestion, sore throat and earache for the past 3 days.

## 2017-08-29 ENCOUNTER — Emergency Department: Payer: Medicaid Other

## 2017-08-29 ENCOUNTER — Emergency Department
Admission: EM | Admit: 2017-08-29 | Discharge: 2017-08-29 | Disposition: A | Discharge status: Home / Self Care | Payer: Medicaid Other | Attending: Emergency Medicine | Admitting: Emergency Medicine

## 2017-08-29 ENCOUNTER — Other Ambulatory Visit: Payer: Self-pay

## 2017-08-29 ENCOUNTER — Encounter: Payer: Self-pay | Admitting: Emergency Medicine

## 2017-08-29 DIAGNOSIS — R197 Diarrhea, unspecified: Secondary | ICD-10-CM | POA: Insufficient documentation

## 2017-08-29 DIAGNOSIS — I10 Essential (primary) hypertension: Secondary | ICD-10-CM | POA: Diagnosis not present

## 2017-08-29 DIAGNOSIS — R112 Nausea with vomiting, unspecified: Secondary | ICD-10-CM | POA: Diagnosis not present

## 2017-08-29 DIAGNOSIS — Z79899 Other long term (current) drug therapy: Secondary | ICD-10-CM | POA: Diagnosis not present

## 2017-08-29 DIAGNOSIS — R109 Unspecified abdominal pain: Secondary | ICD-10-CM | POA: Diagnosis present

## 2017-08-29 LAB — COMPREHENSIVE METABOLIC PANEL
ALT: 25 U/L (ref 14–54)
ANION GAP: 10 (ref 5–15)
AST: 30 U/L (ref 15–41)
Albumin: 4.8 g/dL (ref 3.5–5.0)
Alkaline Phosphatase: 115 U/L (ref 38–126)
BUN: 10 mg/dL (ref 6–20)
CALCIUM: 8.9 mg/dL (ref 8.9–10.3)
CHLORIDE: 103 mmol/L (ref 101–111)
CO2: 21 mmol/L — ABNORMAL LOW (ref 22–32)
CREATININE: 0.64 mg/dL (ref 0.44–1.00)
GFR calc non Af Amer: >60 mL/min (ref >60)
Glucose, Bld: 117 mg/dL — ABNORMAL HIGH (ref 65–99)
Potassium: 4.2 mmol/L (ref 3.5–5.1)
SODIUM: 134 mmol/L — AB (ref 135–145)
Total Bilirubin: 0.6 mg/dL (ref 0.3–1.2)
Total Protein: 8.2 g/dL — ABNORMAL HIGH (ref 6.5–8.1)

## 2017-08-29 LAB — URINALYSIS, COMPLETE (UACMP) WITH MICROSCOPIC
Bilirubin Urine: NEGATIVE
GLUCOSE, UA: NEGATIVE mg/dL
HGB URINE DIPSTICK: NEGATIVE
KETONES UR: NEGATIVE mg/dL
LEUKOCYTES UA: NEGATIVE
Nitrite: NEGATIVE
PH: 7 (ref 5.0–8.0)
Protein, ur: 30 mg/dL — AB
Specific Gravity, Urine: 1.027 (ref 1.005–1.030)

## 2017-08-29 LAB — LIPASE, BLOOD: Lipase: 27 U/L (ref 11–51)

## 2017-08-29 LAB — CBC
HCT: 43.9 % (ref 35.0–47.0)
HEMOGLOBIN: 14.8 g/dL (ref 12.0–16.0)
MCH: 30 pg (ref 26.0–34.0)
MCHC: 33.8 g/dL (ref 32.0–36.0)
MCV: 88.7 fL (ref 80.0–100.0)
PLATELETS: 347 10*3/uL (ref 150–440)
RBC: 4.95 MIL/uL (ref 3.80–5.20)
RDW: 12.8 % (ref 11.5–14.5)
WBC: 24.2 10*3/uL — ABNORMAL HIGH (ref 3.6–11.0)

## 2017-08-29 LAB — POCT PREGNANCY, URINE: Preg Test, Ur: NEGATIVE

## 2017-08-29 MED ORDER — ONDANSETRON HCL 4 MG/2ML IJ SOLN
4.0000 mg | Freq: Once | INTRAMUSCULAR | Status: AC
  Administered 2017-08-29: 4 mg via INTRAVENOUS
  Filled 2017-08-29: qty 2

## 2017-08-29 MED ORDER — LOPERAMIDE HCL 2 MG PO CAPS
2.0000 mg | ORAL_CAPSULE | Freq: Once | ORAL | Status: AC
Start: 2017-08-29 — End: 2017-08-29
  Administered 2017-08-29: 2 mg via ORAL
  Filled 2017-08-29: qty 1

## 2017-08-29 MED ORDER — MORPHINE SULFATE (PF) 2 MG/ML IV SOLN
2.0000 mg | Freq: Once | INTRAVENOUS | Status: AC
  Administered 2017-08-29: 2 mg via INTRAVENOUS
  Filled 2017-08-29: qty 1

## 2017-08-29 MED ORDER — IOPAMIDOL (ISOVUE-300) INJECTION 61%
100.0000 mL | Freq: Once | INTRAVENOUS | Status: AC | PRN
  Administered 2017-08-29: 100 mL via INTRAVENOUS

## 2017-08-29 MED ORDER — ONDANSETRON 4 MG PO TBDP
4.0000 mg | ORAL_TABLET | Freq: Once | ORAL | Status: AC | PRN
  Administered 2017-08-29: 4 mg via ORAL
  Filled 2017-08-29: qty 1

## 2017-08-29 MED ORDER — SODIUM CHLORIDE 0.9 % IV BOLUS (SEPSIS)
1000.0000 mL | Freq: Once | INTRAVENOUS | Status: AC
  Administered 2017-08-29: 1000 mL via INTRAVENOUS

## 2017-08-29 MED ORDER — ONDANSETRON 4 MG PO TBDP: 4.0000 mg | ORAL_TABLET | Freq: Four times a day (QID) | ORAL | 0 refills | Status: DC | PRN

## 2017-08-29 NOTE — Discharge Instructions (Signed)
? ?  Please return to the emergency room right away if you are to develop a fever, severe nausea, your pain becomes severe or worsens, you are unable to keep food down, begin vomiting any dark or bloody fluid, you develop any dark or bloody stools, feel dehydrated, or other new concerns or symptoms arise. ? ?

## 2017-08-29 NOTE — ED Provider Notes (Signed)
[facility] Emergency Department Provider Note  ____________________________________________   First MD Initiated Contact with Patient 08/29/17 1153     (approximate)  I have reviewed the triage vital signs and the nursing notes.   HISTORY  Chief Complaint Abdominal Pain; Emesis; and Diarrhea    HPI Carlisle BeersSereida Delia Headyrias Sanchez is a 27 y.o. female here for evaluation of nausea vomiting diarrhea  Patient reports that she had dinner with her husband last night, and about 5 in the morning she suddenly started feeling nauseated and began vomiting reports she is vomiting of food water and acid.  She also then began having loose diarrhea and crampy stomach pain.  She has had ongoing nausea and vomiting intermittently for the last several hours.  Her husband also began experiencing similar symptoms and vomited a couple of times earlier but this seems to be improving.  No fevers or chills.  Denies any lower abdominal pain or pelvic pain.  Reports mostly crampy discomfort in the mid abdomen that comes and goes.  Currently reports only minimal discomfort, but ongoing nausea and reports she has had 2 loose stools in the ER.  No history of travel.  Does report her husband with the same symptoms occurring approximately the same time this morning    Past Medical History:  Diagnosis Date  . Breast abscess   . Hypertension     Patient Active Problem List   Diagnosis Date Noted  . Left breast abscess     Past Surgical History:  Procedure Laterality Date  . sebaceous cyst excision  2015   Back    Prior to Admission medications   Medication Sig Start Date End Date Taking? Authorizing Provider  benzonatate (TESSALON PERLES) 100 MG capsule Take 1 capsule (100 mg total) by mouth 3 (three) times daily as needed for cough (Take 1-2 per dose). 09/20/16   Menshew, Charlesetta IvoryJenise V Bacon, PA-C  brompheniramine-pseudoephedrine-DM 30-2-10 MG/5ML syrup Take 5 mLs by mouth 4 (four) times  daily as needed. 09/20/16   Menshew, Charlesetta IvoryJenise V Bacon, PA-C  fluticasone (FLONASE) 50 MCG/ACT nasal spray Place 2 sprays into both nostrils daily. 09/20/16   Menshew, Charlesetta IvoryJenise V Bacon, PA-C  ondansetron (ZOFRAN ODT) 4 MG disintegrating tablet Take 1 tablet (4 mg total) by mouth every 6 (six) hours as needed for nausea or vomiting. 08/29/17   Sharyn CreamerQuale, Opel Lejeune, MD  sulfamethoxazole-trimethoprim (BACTRIM DS,SEPTRA DS) 800-160 MG tablet Take 1 tablet by mouth 2 (two) times daily. 10/01/15   Gladis RiffleLoflin, Catherine L, MD    Allergies Patient has no known allergies.  Family History  Problem Relation Age of Onset  . Diabetes Mother     Social History Social History   Tobacco Use  . Smoking status: Never Smoker  . Smokeless tobacco: Never Used  Substance Use Topics  . Alcohol use: No  . Drug use: No    Review of Systems Constitutional: No fever/chills Eyes: No visual changes. ENT: No sore throat. Cardiovascular: Denies chest pain. Respiratory: Denies shortness of breath. Gastrointestinal: No black or bloody stool.  No constipation. Genitourinary: Negative for dysuria.  Denies pregnancy. Musculoskeletal: Negative for back pain. Skin: Negative for rash. Neurological: Negative for headaches, focal weakness or numbness.    ____________________________________________   PHYSICAL EXAM:  VITAL SIGNS: ED Triage Vitals  Enc Vitals Group     BP 08/29/17 1115 114/71     Pulse Rate 08/29/17 1115 79     Resp 08/29/17 1115 16     Temp 08/29/17 1115 98.7  F (37.1 C)     Temp Source 08/29/17 1115 Oral     SpO2 08/29/17 1115 96 %     Weight 08/29/17 1116 170 lb (77.1 kg)     Height 08/29/17 1116 5\' 1"  (1.549 m)     Head Circumference --      Peak Flow --      Pain Score 08/29/17 1116 9     Pain Loc --      Pain Edu? --      Excl. in GC? --     Constitutional: Alert and oriented. Well appearing and in no acute distress. Eyes: Conjunctivae are normal. Head: Atraumatic. Nose: No  congestion/rhinnorhea. Mouth/Throat: Mucous membranes are moist. Neck: No stridor.   Cardiovascular: Normal rate, regular rhythm. Grossly normal heart sounds.  Good peripheral circulation. Respiratory: Normal respiratory effort.  No retractions. Lungs CTAB. Gastrointestinal: Soft, mild tenderness in the left upper quadrant.  No rebound or guarding.  Negative Murphy.  No pain to McBurney's point.  No rebound or guarding in any quadrant.  Denies pain in the right lower quadrant. Musculoskeletal: No lower extremity tenderness nor edema. Neurologic:  Normal speech and language. No gross focal neurologic deficits are appreciated.  Skin:  Skin is warm, dry and intact. No rash noted. Psychiatric: Mood and affect are normal. Speech and behavior are normal.  ____________________________________________   LABS (all labs ordered are listed, but only abnormal results are displayed)  Labs Reviewed  COMPREHENSIVE METABOLIC PANEL - Abnormal; Notable for the following components:      Result Value   Sodium 134 (*)    CO2 21 (*)    Glucose, Bld 117 (*)    Total Protein 8.2 (*)    All other components within normal limits  CBC - Abnormal; Notable for the following components:   WBC 24.2 (*)    All other components within normal limits  URINALYSIS, COMPLETE (UACMP) WITH MICROSCOPIC - Abnormal; Notable for the following components:   Color, Urine AMBER (*)    APPearance HAZY (*)    Protein, ur 30 (*)    Bacteria, UA RARE (*)    Squamous Epithelial / LPF 0-5 (*)    All other components within normal limits  GASTROINTESTINAL PANEL BY PCR, STOOL (REPLACES STOOL CULTURE)  LIPASE, BLOOD  POC URINE PREG, ED  POCT PREGNANCY, URINE   ____________________________________________  EKG   ____________________________________________  RADIOLOGY  CT abdomen pelvis, negative for acute reviewed by me ____________________________________________   PROCEDURES  Procedure(s) performed:  None  Procedures  Critical Care performed: No  ____________________________________________   INITIAL IMPRESSION / ASSESSMENT AND PLAN / ED COURSE  Pertinent labs & imaging results that were available during my care of the patient were reviewed by me and considered in my medical decision making (see chart for details).  Differential diagnosis includes but is not limited to, abdominal perforation, aortic dissection, cholecystitis, appendicitis, diverticulitis, colitis, esophagitis/gastritis, kidney stone, pyelonephritis, urinary tract infection, aortic aneurysm. All are considered in decision and treatment plan. Based upon the patient's presentation and risk factors, and negative pregnancy status will proceed with CT scan.  Though her symptoms sound most consistent with likely self-limited gastrointestinal disease, her white blood cell count of 24,000 is noted, prompting me to order a CT scan to further evaluate for etiology.  ----------------------------------------- 2:14 PM on 08/29/2017 -----------------------------------------  Patient feels much better.  No ongoing diarrhea or vomiting.  She reports she feels well, no ongoing pain or discomfort.  The patient appears appropriate  for ongoing outpatient therapy, careful abdominal pain precautions discussed.  Patient in no distress, appears appropriate for ongoing outpatient management and encouraged her to follow-up with her primary doctor in 1-2 days.  Return precautions and treatment recommendations and follow-up discussed with the patient who is agreeable with the plan.        ____________________________________________   FINAL CLINICAL IMPRESSION(S) / ED DIAGNOSES  Final diagnoses:  Nausea vomiting and diarrhea      NEW MEDICATIONS STARTED DURING THIS VISIT:  New Prescriptions   ONDANSETRON (ZOFRAN ODT) 4 MG DISINTEGRATING TABLET    Take 1 tablet (4 mg total) by mouth every 6 (six) hours as needed for nausea or  vomiting.     Note:  This document was prepared using Dragon voice recognition software and may include unintentional dictation errors.     Sharyn Creamer, MD 08/29/17 1415

## 2017-08-29 NOTE — ED Notes (Signed)
Pt returned from CT °

## 2017-08-29 NOTE — ED Triage Notes (Signed)
Pt reports RUQ abdominal pain since 0500 reports several episodes and diarrhea. Pt reports last episode of vomiting 30 minutes ago and diarrhea prior to arrival. Pt denies any other symptoms denies any shortness of breath denies any chest pain.

## 2017-08-29 NOTE — ED Notes (Addendum)
Pt states started with sharp lower abdominal pain around 5am. Started with N&V&D. Alert, oriented. Still has abdominal organs. States ODT zofran helped "a little bit"

## 2017-08-29 NOTE — ED Notes (Signed)
Pt reports she took antidiarrhea medications around 0700

## 2017-08-29 NOTE — ED Notes (Signed)
First nurse note-here for NVD since last night. Alert, reports cannot keep anything down. NAD

## 2017-08-29 NOTE — ED Notes (Signed)
Pt taken to CT.

## 2018-03-08 ENCOUNTER — Emergency Department
Admission: EM | Admit: 2018-03-08 | Discharge: 2018-03-08 | Disposition: A | Discharge status: Home / Self Care | Payer: Medicaid Other | Attending: Student in an Organized Health Care Education/Training Program | Admitting: Student in an Organized Health Care Education/Training Program

## 2018-03-08 ENCOUNTER — Encounter: Payer: Self-pay | Admitting: Emergency Medicine

## 2018-03-08 DIAGNOSIS — I10 Essential (primary) hypertension: Secondary | ICD-10-CM | POA: Insufficient documentation

## 2018-03-08 DIAGNOSIS — O9989 Other specified diseases and conditions complicating pregnancy, childbirth and the puerperium: Secondary | ICD-10-CM | POA: Insufficient documentation

## 2018-03-08 DIAGNOSIS — M5441 Lumbago with sciatica, right side: Secondary | ICD-10-CM | POA: Diagnosis not present

## 2018-03-08 DIAGNOSIS — Z3A00 Weeks of gestation of pregnancy not specified: Secondary | ICD-10-CM | POA: Diagnosis not present

## 2018-03-08 LAB — COMPREHENSIVE METABOLIC PANEL
ALBUMIN: 3.6 g/dL (ref 3.5–5.0)
ALT: 21 U/L (ref 0–44)
ANION GAP: 6 (ref 5–15)
AST: 19 U/L (ref 15–41)
Alkaline Phosphatase: 53 U/L (ref 38–126)
BUN: 6 mg/dL (ref 6–20)
CHLORIDE: 107 mmol/L (ref 98–111)
CO2: 23 mmol/L (ref 22–32)
Calcium: 8.5 mg/dL — ABNORMAL LOW (ref 8.9–10.3)
Creatinine, Ser: 0.37 mg/dL — ABNORMAL LOW (ref 0.44–1.00)
GFR calc Af Amer: >60 mL/min (ref >60)
GFR calc non Af Amer: >60 mL/min (ref >60)
GLUCOSE: 106 mg/dL — AB (ref 70–99)
POTASSIUM: 3.8 mmol/L (ref 3.5–5.1)
Sodium: 136 mmol/L (ref 135–145)
Total Bilirubin: 0.5 mg/dL (ref 0.3–1.2)
Total Protein: 6.5 g/dL (ref 6.5–8.1)

## 2018-03-08 LAB — CBC
HCT: 34.5 % — ABNORMAL LOW (ref 35.0–47.0)
HEMOGLOBIN: 12.2 g/dL (ref 12.0–16.0)
MCH: 31.7 pg (ref 26.0–34.0)
MCHC: 35.4 g/dL (ref 32.0–36.0)
MCV: 89.5 fL (ref 80.0–100.0)
PLATELETS: 300 10*3/uL (ref 150–440)
RBC: 3.86 MIL/uL (ref 3.80–5.20)
RDW: 13.1 % (ref 11.5–14.5)
WBC: 7.7 10*3/uL (ref 3.6–11.0)

## 2018-03-08 LAB — URINALYSIS, COMPLETE (UACMP) WITH MICROSCOPIC
Bacteria, UA: NONE SEEN
Bilirubin Urine: NEGATIVE
GLUCOSE, UA: NEGATIVE mg/dL
HGB URINE DIPSTICK: NEGATIVE
Ketones, ur: NEGATIVE mg/dL
Leukocytes, UA: NEGATIVE
NITRITE: NEGATIVE
PROTEIN: NEGATIVE mg/dL
Specific Gravity, Urine: 1.003 — ABNORMAL LOW (ref 1.005–1.030)
WBC, UA: NONE SEEN WBC/hpf (ref 0–5)
pH: 7 (ref 5.0–8.0)

## 2018-03-08 LAB — POCT PREGNANCY, URINE: Preg Test, Ur: POSITIVE — AB

## 2018-03-08 LAB — LIPASE, BLOOD: LIPASE: 34 U/L (ref 11–51)

## 2018-03-08 MED ORDER — BUPIVACAINE HCL (PF) 0.5 % IJ SOLN: 30.0000 mL | Freq: Once | INTRAMUSCULAR | Status: DC

## 2018-03-08 MED ORDER — ACETAMINOPHEN 500 MG PO TABS
1000.0000 mg | ORAL_TABLET | Freq: Once | ORAL | Status: AC
  Administered 2018-03-08: 1000 mg via ORAL
  Filled 2018-03-08: qty 2

## 2018-03-08 NOTE — Discharge Instructions (Addendum)
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
## 2018-08-08 ENCOUNTER — Other Ambulatory Visit: Payer: Self-pay

## 2018-08-08 ENCOUNTER — Observation Stay
Admission: EM | Admit: 2018-08-08 | Discharge: 2018-08-08 | Disposition: A | Discharge status: Home / Self Care | Payer: Medicaid Other | Attending: Obstetrics & Gynecology | Admitting: Obstetrics & Gynecology

## 2018-08-08 DIAGNOSIS — Z3A35 35 weeks gestation of pregnancy: Secondary | ICD-10-CM

## 2018-08-08 DIAGNOSIS — O10913 Unspecified pre-existing hypertension complicating pregnancy, third trimester: Secondary | ICD-10-CM | POA: Insufficient documentation

## 2018-08-08 DIAGNOSIS — O4703 False labor before 37 completed weeks of gestation, third trimester: Secondary | ICD-10-CM | POA: Diagnosis not present

## 2018-08-08 LAB — WET PREP, GENITAL
Clue Cells Wet Prep HPF POC: NONE SEEN
Sperm: NONE SEEN
Trich, Wet Prep: NONE SEEN
YEAST WET PREP: NONE SEEN

## 2018-08-08 LAB — URINALYSIS, COMPLETE (UACMP) WITH MICROSCOPIC
Bilirubin Urine: NEGATIVE
GLUCOSE, UA: NEGATIVE mg/dL
Hgb urine dipstick: NEGATIVE
Ketones, ur: 5 mg/dL — AB
Leukocytes, UA: NEGATIVE
Nitrite: NEGATIVE
Protein, ur: NEGATIVE mg/dL
Specific Gravity, Urine: 1.011 (ref 1.005–1.030)
pH: 6 (ref 5.0–8.0)

## 2018-08-08 LAB — CHLAMYDIA/NGC RT PCR (ARMC ONLY)
Chlamydia Tr: NOT DETECTED
N gonorrhoeae: NOT DETECTED

## 2018-08-08 MED ORDER — ACETAMINOPHEN 325 MG PO TABS: 650.0000 mg | ORAL_TABLET | ORAL | Status: DC | PRN

## 2018-08-08 NOTE — Final Progress Note (Signed)
Physician Final Progress Note  Patient ID: Basil DessSereida Gandolfo Sanchez MRN: [id number] DOB/AGE: March 11, 1991 28 y.o.  Admit date: 08/08/2018 Admitting provider: Nadara Mustardobert P Harris, MD Discharge date: 08/08/2018   Admission Diagnoses: Vaginal discharge, irregular contractions  Discharge Diagnoses: Irregular uterine contractions at term  History of Present Illness: The patient is a 28 y.o. female G2P1001 at 7182w3d seen at [facility] who presents for occasional contractions since 0300 this morning and an increase in discharge over the past few days that has ranged in color from yellowish to brownish to pink tinged. She rates her pain as 4/10. Nothing has made it better or worse. She has not had any significant amount of vaginal bleeding although she has seen a pink tinge to the discharge once when wiping. She has not been leaking fluid. Her baby is moving well.  Review of Systems: Review of systems negative unless otherwise noted in HPI.   Past Medical History:  Diagnosis Date  . Breast abscess   . Hypertension     Past Surgical History:  Procedure Laterality Date  . sebaceous cyst excision  2015   Back    No current facility-administered medications on file prior to encounter.    No current outpatient medications on file prior to encounter.    No Known Allergies  Social History   Socioeconomic History  . Marital status: Married    Spouse name: Not on file  . Number of children: Not on file  . Years of education: Not on file  . Highest education level: Not on file  Occupational History  . Not on file  Social Needs  . Financial resource strain: Not on file  . Food insecurity:    Worry: Not on file    Inability: Not on file  . Transportation needs:    Medical: Not on file    Non-medical: Not on file  Tobacco Use  . Smoking status: Never Smoker  . Smokeless tobacco: Never Used  Substance and Sexual Activity  . Alcohol use: No  . Drug use: No  . Sexual activity: Not on file   Lifestyle  . Physical activity:    Days per week: Not on file    Minutes per session: Not on file  . Stress: Not on file  Relationships  . Social connections:    Talks on phone: Not on file    Gets together: Not on file    Attends religious service: Not on file    Active member of club or organization: Not on file    Attends meetings of clubs or organizations: Not on file    Relationship status: Not on file  . Intimate partner violence:    Fear of current or ex partner: Not on file    Emotionally abused: Not on file    Physically abused: Not on file    Forced sexual activity: Not on file  Other Topics Concern  . Not on file  Social History Narrative  . Not on file    Family history: Family History  Problem Relation Age of Onset  . Diabetes Mother     Physical Exam: BP 128/80 (BP Location: Right Arm)   Pulse 84   Temp 98.2 F (36.8 C) (Oral)   Resp 18   Ht 5\' 1"  (1.549 m)   Wt 78 kg   LMP 12/03/2017   BMI 32.50 kg/m   Gen: NAD CV: Regular rate Pulm: No increased work of breathing Pelvic: 1/20/-1, unchanged on re-examination after two hours of  observation SSE showed small amount of whitish discharge, no pooling, friable cervix when collecting samples. Ferning negative on microscopy. Ext: No signs of DVT  NST Baseline: 140 Variability: moderate Accelerations: present Decelerations: absent Tocometry: irregular, every 3-10 minutes The patient was monitored for >60 minutes, fetal heart rate tracing was deemed reactive.  Consults: None  Significant Findings/ Diagnostic Studies: labs: negative wet prep, UA, and GC/chlamydia  Procedures: NST, SSE  Discharge Condition: good  Disposition: Discharge disposition: 01-Home or Self Care       Diet: Regular diet  Discharge Activity: Activity as tolerated  Discharge Instructions    Discharge activity:  No Restrictions   Complete by:  As directed    Discharge diet:  No restrictions   Complete by:  As  directed    Fetal Kick Count:  Lie on our left side for one hour after a meal, and count the number of times your baby kicks.  If it is less than 5 times, get up, move around and drink some juice.  Repeat the test 30 minutes later.  If it is still less than 5 kicks in an hour, notify your doctor.   Complete by:  As directed    LABOR:  When conractions begin, you should start to time them from the beginning of one contraction to the beginning  of the next.  When contractions are 5 - 10 minutes apart or less and have been regular for at least an hour, you should call your health care provider.   Complete by:  As directed    No sexual activity restrictions   Complete by:  As directed    Notify physician for bleeding from the vagina   Complete by:  As directed    Notify physician for blurring of vision or spots before the eyes   Complete by:  As directed    Notify physician for chills or fever   Complete by:  As directed    Notify physician for fainting spells, "black outs" or loss of consciousness   Complete by:  As directed    Notify physician for increase in vaginal discharge   Complete by:  As directed    Notify physician for leaking of fluid   Complete by:  As directed    Notify physician for pain or burning when urinating   Complete by:  As directed    Notify physician for pelvic pressure (sudden increase)   Complete by:  As directed    Notify physician for severe or continued nausea or vomiting   Complete by:  As directed    Notify physician for sudden gushing of fluid from the vagina (with or without continued leaking)   Complete by:  As directed    Notify physician for sudden, constant, or occasional abdominal pain   Complete by:  As directed    Notify physician if baby moving less than usual   Complete by:  As directed      Allergies as of 08/08/2018   No Known Allergies     Medication List    STOP taking these medications   benzonatate 100 MG capsule Commonly known as:   TESSALON PERLES   brompheniramine-pseudoephedrine-DM 30-2-10 MG/5ML syrup   fluticasone 50 MCG/ACT nasal spray Commonly known as:  FLONASE   ondansetron 4 MG disintegrating tablet Commonly known as:  ZOFRAN ODT   sulfamethoxazole-trimethoprim 800-160 MG tablet Commonly known as:  BACTRIM DS,SEPTRA DS      Labor precautions emphasized.  Signed: Rosalyn Gess  Bridgette Habermann, CNM  08/08/2018

## 2018-08-08 NOTE — Discharge Summary (Signed)
See Final Progress Note 08/08/2018. 

## 2018-08-08 NOTE — OB Triage Note (Signed)
Pt G2P1 presents at [redacted]w[redacted]d from ED with c/o yellow/brown and clear discharge with some pink tinge that started yesterday. Pt states it has been small amount. Pt also reports lower abd pain that started around 3am and rates 4/10. Reports +FM. Pt is seen at the [facility]. VSS. Monitors applied. Will continue to monitor

## 2018-10-09 ENCOUNTER — Other Ambulatory Visit: Payer: Self-pay

## 2018-10-09 ENCOUNTER — Emergency Department
Admission: EM | Admit: 2018-10-09 | Discharge: 2018-10-09 | Disposition: A | Discharge status: Home / Self Care | Payer: Medicaid Other | Attending: Emergency Medicine | Admitting: Emergency Medicine

## 2018-10-09 ENCOUNTER — Encounter: Payer: Self-pay | Admitting: Emergency Medicine

## 2018-10-09 ENCOUNTER — Emergency Department: Payer: Medicaid Other

## 2018-10-09 DIAGNOSIS — R109 Unspecified abdominal pain: Secondary | ICD-10-CM

## 2018-10-09 DIAGNOSIS — E876 Hypokalemia: Secondary | ICD-10-CM | POA: Insufficient documentation

## 2018-10-09 DIAGNOSIS — K8020 Calculus of gallbladder without cholecystitis without obstruction: Secondary | ICD-10-CM | POA: Diagnosis not present

## 2018-10-09 DIAGNOSIS — I10 Essential (primary) hypertension: Secondary | ICD-10-CM | POA: Insufficient documentation

## 2018-10-09 DIAGNOSIS — R1013 Epigastric pain: Secondary | ICD-10-CM | POA: Diagnosis present

## 2018-10-09 DIAGNOSIS — K8050 Calculus of bile duct without cholangitis or cholecystitis without obstruction: Secondary | ICD-10-CM | POA: Insufficient documentation

## 2018-10-09 LAB — URINALYSIS, COMPLETE (UACMP) WITH MICROSCOPIC
Bacteria, UA: NONE SEEN
Bilirubin Urine: NEGATIVE
Glucose, UA: NEGATIVE mg/dL
Hgb urine dipstick: NEGATIVE
Ketones, ur: NEGATIVE mg/dL
Leukocytes,Ua: NEGATIVE
Nitrite: NEGATIVE
Protein, ur: NEGATIVE mg/dL
Specific Gravity, Urine: 1.014 (ref 1.005–1.030)
pH: 7 (ref 5.0–8.0)

## 2018-10-09 LAB — COMPREHENSIVE METABOLIC PANEL
ALT: 54 U/L — ABNORMAL HIGH (ref 0–44)
AST: 79 U/L — ABNORMAL HIGH (ref 15–41)
Albumin: 4 g/dL (ref 3.5–5.0)
Alkaline Phosphatase: 85 U/L (ref 38–126)
Anion gap: 11 (ref 5–15)
BILIRUBIN TOTAL: 0.7 mg/dL (ref 0.3–1.2)
BUN: 15 mg/dL (ref 6–20)
CO2: 27 mmol/L (ref 22–32)
Calcium: 8.8 mg/dL — ABNORMAL LOW (ref 8.9–10.3)
Chloride: 100 mmol/L (ref 98–111)
Creatinine, Ser: 0.64 mg/dL (ref 0.44–1.00)
Glucose, Bld: 95 mg/dL (ref 70–99)
Potassium: 3 mmol/L — ABNORMAL LOW (ref 3.5–5.1)
Sodium: 138 mmol/L (ref 135–145)
Total Protein: 7 g/dL (ref 6.5–8.1)

## 2018-10-09 LAB — HCG, QUANTITATIVE, PREGNANCY: hCG, Beta Chain, Quant, S: 1 m[IU]/mL (ref <5)

## 2018-10-09 LAB — CBC
HCT: 40.6 % (ref 36.0–46.0)
Hemoglobin: 14.2 g/dL (ref 12.0–15.0)
MCH: 31.5 pg (ref 26.0–34.0)
MCHC: 35 g/dL (ref 30.0–36.0)
MCV: 90 fL (ref 80.0–100.0)
NRBC: 0 % (ref 0.0–0.2)
Platelets: 443 10*3/uL — ABNORMAL HIGH (ref 150–400)
RBC: 4.51 MIL/uL (ref 3.87–5.11)
RDW: 13.3 % (ref 11.5–15.5)
WBC: 15.5 10*3/uL — AB (ref 4.0–10.5)

## 2018-10-09 LAB — MAGNESIUM: MAGNESIUM: 2.3 mg/dL (ref 1.7–2.4)

## 2018-10-09 LAB — LIPASE, BLOOD: Lipase: 79 U/L — ABNORMAL HIGH (ref 11–51)

## 2018-10-09 MED ORDER — ONDANSETRON HCL 4 MG/2ML IJ SOLN
4.0000 mg | Freq: Once | INTRAMUSCULAR | Status: AC
  Administered 2018-10-09: 4 mg via INTRAVENOUS

## 2018-10-09 MED ORDER — SODIUM CHLORIDE 0.9% FLUSH: 3.0000 mL | Freq: Once | INTRAVENOUS | Status: DC

## 2018-10-09 MED ORDER — POTASSIUM CHLORIDE 10 MEQ/100ML IV SOLN
10.0000 meq | Freq: Once | INTRAVENOUS | Status: DC
  Administered 2018-10-09: 10 meq via INTRAVENOUS
  Filled 2018-10-09: qty 100

## 2018-10-09 MED ORDER — POTASSIUM CHLORIDE CRYS ER 20 MEQ PO TBCR
40.0000 meq | EXTENDED_RELEASE_TABLET | Freq: Once | ORAL | Status: AC
  Administered 2018-10-09: 40 meq via ORAL
  Filled 2018-10-09: qty 2

## 2018-10-09 MED ORDER — MORPHINE SULFATE (PF) 4 MG/ML IV SOLN
4.0000 mg | Freq: Once | INTRAVENOUS | Status: AC
  Administered 2018-10-09: 4 mg via INTRAVENOUS
  Filled 2018-10-09: qty 1

## 2018-10-09 MED ORDER — ONDANSETRON HCL 4 MG/2ML IJ SOLN
INTRAMUSCULAR | Status: AC
  Filled 2018-10-09: qty 2

## 2018-10-09 MED ORDER — SODIUM CHLORIDE 0.9 % IV BOLUS
1000.0000 mL | Freq: Once | INTRAVENOUS | Status: AC
  Administered 2018-10-09: 1000 mL via INTRAVENOUS

## 2018-10-09 NOTE — ED Notes (Signed)
Pt to US at this time.

## 2018-10-09 NOTE — ED Triage Notes (Signed)
Pt c/o epigastric pain that radiates straight through to her back; pt says she started vomiting around 11pm; pain is constant; sharp pain; no history of same; pt says she had [employer] around 10pm;

## 2018-10-09 NOTE — ED Notes (Signed)
Dr. Quale at bedside.  

## 2018-10-09 NOTE — Discharge Instructions (Signed)
You were seen in the emergency room for abdominal pain. It is important that you follow up closely with Dr. Tonna Boehringer in this clinic this week. Call Monday for an appointment.  Please return to the emergency room right away if you are to develop a fever, severe nausea, your pain returns, you are unable to keep food down, begin vomiting any dark or bloody fluid, you develop any dark or bloody stools, feel dehydrated, or other new concerns or symptoms arise.

## 2018-10-09 NOTE — ED Notes (Signed)
Pt is calling for ride at this time. Pt has been instructed by this RN to push call bell when ride arrives. Pt verbalized understanding.

## 2018-10-09 NOTE — ED Provider Notes (Signed)
[facility] Emergency Department Provider Note   ____________________________________________   First MD Initiated Contact with Patient 10/09/18 [phone]     (approximate)  I have reviewed the triage vital signs and the nursing notes.   HISTORY  Chief Complaint Abdominal Pain    HPI Kendra Santos is a 28 y.o. 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  Patient presents today, reports she ate at about 10 PM and around 11 PM had a very sudden onset of abdominal pain.  Severe located in the upper middle of her abdomen and radiates straight to her back.  Associated with vomiting several times nonbloody.  No diarrhea.  No fevers or chills.  No chest pain.  Reports it feels like a piercing pressure in the middle of her upper abdomen.  She is never had pain like this before.  She has never had any abdominal surgeries or C-section  She does have a history of a breast abscess, she is treated with clindamycin for that and followed by [facility] breast center and reports she has not noticed any worsening of this, she is not noticed any redness pain or unusual discomfort in the right breast.  Reports she does not think it is related to today's symptoms.  No shortness of breath or trouble breathing.  No diarrhea.   Past Medical History:  Diagnosis Date   Breast abscess    Hypertension     Patient Active Problem List   Diagnosis Date Noted   Indication for care in labor and delivery, antepartum 08/08/2018   Left breast abscess     Past Surgical History:  Procedure Laterality Date   sebaceous cyst excision  2015   Back    Prior to Admission medications   Not on File    Allergies Patient has no known allergies.  Family History  Problem Relation Age of Onset   Diabetes Mother     Social History Social History   Tobacco Use    Smoking status: Never Smoker   Smokeless tobacco: Never Used  Substance Use Topics   Alcohol use: No   Drug use: No    Review of Systems Constitutional: No fever/chills Eyes: No visual changes. ENT: No sore throat. Cardiovascular: Denies chest pain. Respiratory: Denies shortness of breath. Gastrointestinal: See HPI Genitourinary: Negative for dysuria.  She denies pregnancy.  Was recently pregnant. Musculoskeletal: Negative for back pain. Skin: Negative for rash.  See HPI regarding right breast mastitis, chronic. Neurological: Negative for headaches, areas of focal weakness or numbness.    ____________________________________________   PHYSICAL EXAM:  VITAL SIGNS: ED Triage Vitals  Enc Vitals Group     BP 10/09/18 0322 131/83     Pulse Rate 10/09/18 0322 76     Resp 10/09/18 0322 17     Temp 10/09/18 0322 97.7 F (36.5 C)     Temp Source 10/09/18 0322 Oral     SpO2 10/09/18 0322 99 %     Weight 10/09/18 0323 162 lb (73.5 kg)     Height 10/09/18 0323 5\' 2"  (1.575 m)     Head Circumference --      Peak Flow --      Pain Score 10/09/18 0323 9     Pain Loc --      Pain Edu? --      Excl. in GC? --     Constitutional:  Alert and oriented.  Appears in pain and nauseated.  Holding her hand over her mid upper abdomen.  Reports feeling nauseated with pain in the upper abdomen. Eyes: Conjunctivae are normal. Head: Atraumatic. Nose: No congestion/rhinnorhea. Mouth/Throat: Mucous membranes are moist. Neck: No stridor.  Cardiovascular: Normal rate, regular rhythm. Grossly normal heart sounds.  Good peripheral circulation. Respiratory: Normal respiratory effort.  No retractions. Lungs CTAB. Gastrointestinal: Soft and nontender in the lower quadrants, very tender in the epigastrium and right upper quadrant where she has a positive Murphy. No distention.  No vaginal bleeding or discharge.  No lower abdominal or pelvic pain. Musculoskeletal: No lower extremity tenderness nor  edema. Neurologic:  Normal speech and language. No gross focal neurologic deficits are appreciated.  Skin:  Skin is warm, dry and intact. No rash noted. Psychiatric: Mood and affect are normal. Speech and behavior are normal.  ____________________________________________   LABS (all labs ordered are listed, but only abnormal results are displayed)  Labs Reviewed  LIPASE, BLOOD - Abnormal; Notable for the following components:      Result Value   Lipase 79 (*)    All other components within normal limits  COMPREHENSIVE METABOLIC PANEL - Abnormal; Notable for the following components:   Potassium 3.0 (*)    Calcium 8.8 (*)    AST 79 (*)    ALT 54 (*)    All other components within normal limits  CBC - Abnormal; Notable for the following components:   WBC 15.5 (*)    Platelets 443 (*)    All other components within normal limits  URINALYSIS, COMPLETE (UACMP) WITH MICROSCOPIC - Abnormal; Notable for the following components:   Color, Urine YELLOW (*)    APPearance CLEAR (*)    All other components within normal limits  HCG, QUANTITATIVE, PREGNANCY  MAGNESIUM   ____________________________________________  EKG  Reviewed at 448 Heart rate 60 QRS 90 QTc 410 Normal sinus rhythm, a slight U wave is appreciable in anterior precordial leads.  ____________________________________________  RADIOLOGY  US Abdomen Limited Ruq  Result Date: 10/09/2018 CLINICAL DATA:  Abdominal pain EXAM: ULTRASOUND ABDOMEN LIMITED RIGHT UPPER QUADRANT COMPARISON:  CT abdomen pelvis 08/29/2017 FINDINGS: Gallbladder: Cholelithiasis with stones measuring up to 6 mm. A negative sonographic Eulah Pont sign was reported by the sonographer. No wall thickening or pericholecystic fluid. Common bile duct: Diameter: 4 mm Liver: No focal lesion identified. Within normal limits in parenchymal echogenicity. Portal vein is patent on color Doppler imaging with normal direction of blood flow towards the liver. IMPRESSION:  Cholelithiasis without other evidence of acute cholecystitis. Electronically Signed   By: Deatra Robinson M.D.   On: 10/09/2018 04:51    Personally viewed the ultrasound, discussed results with Dr. Tonna Boehringer. Cholelithiasis no active evidence acute cholecystitis.  Normal common bile duct ____________________________________________   PROCEDURES  Procedure(s) performed: None  Procedures  Critical Care performed: No  ____________________________________________   INITIAL IMPRESSION / ASSESSMENT AND PLAN / ED COURSE  Pertinent labs & imaging results that were available during my care of the patient were reviewed by me and considered in my medical decision making (see chart for details).   Differential diagnosis includes but is not limited to, abdominal perforation, aortic dissection, cholecystitis, appendicitis, diverticulitis, colitis, esophagitis/gastritis, kidney stone, pyelonephritis, urinary tract infection, etc. All are considered in decision and treatment plan. Based upon the patient's presentation and risk factors, I am very concerned with her presentation that this could represent cholelithiasis, cholecystitis, hepatobiliary disease or pancreatitis/gastric etiology given the location of  pain.  Her symptomatology was sudden onset seems to favor likely some type of hepatobiliary type process.  Patient agreeable with plan to receive Zofran and IV fluids, then morphine after ultrasound is completed.  Discussed with ultrasound, the anticipate being able to get her exam done within about 15 minutes of the order being placed.  Pregnancy test neg   Clinical Course as of Oct 08 608  Wynelle Link Oct 09, 2018  7543 Case discussed in consult placed with Dr. Tonna Boehringer of general surgery.  He will see the patient.   [MQ]  0533 Patient is seen and seen in consult on by Dr. Tonna Boehringer.  Patient is currently pain-free and reports she feels much better.  Abdomen soft nontender nondistended this time.  Dr. Tonna Boehringer advises  that the patient may be discharged with close general surgery follow-up with a diagnosis of biliary colic.  I am in agreement with his clinical assessment at this time, the patient is much improved and currently pain-free.  We will switch to oral potassium and do a by mouth challenge prior to plan for discharge.  I did discuss this plan with the patient, also discussed very careful return precautions and she is agreeable with this plan.   [MQ]  0603 WBC elevated, she is on chronic steroids as well.   [MQ]    Clinical Course User Index [MQ] Sharyn Creamer, MD  ----------------------------------------- 4:54 AM on 10/09/2018 -----------------------------------------  Await ultrasound read.  Patient reports her pain has improved, still having some mild pain but much more comfortable located in the mid epigastrium and right upper quadrant.  She appears much more comfortable.  ----------------------------------------- 6:41 AM on 10/09/2018 -----------------------------------------  Patient has passed her p.o. challenge, no ongoing pain or discomfort.  Appears well and agreeable with plan for close follow-up this week in general surgery clinic and careful return precautions which I reviewed with her.  ____________________________________________   FINAL CLINICAL IMPRESSION(S) / ED DIAGNOSES  Final diagnoses:  Abdominal pain  Hypokalemia  Biliary colic  Gallstones        Note:  This document was prepared using Dragon voice recognition software and may include unintentional dictation errors       Sharyn Creamer, MD 10/09/18 [phone]

## 2018-10-09 NOTE — Consult Note (Signed)
Subjective:   CC: Biliary colic and hypokalemia  HPI:  Kendra Santos is a 28 y.o. female who is consulted by [facility] for evaluation of above cc.  Symptoms were first noted 7 hours ago. Pain is sharp located in the epigastric region, radiating straight towards the back.  Sudden onset, had Mindi Slicker for dinner last night.  Associated with nausea and vomiting, exacerbated by nothing specific.  OTC Motrin did not alleviate the pain so patient came into the ED for further management.     Past Medical History:  has a past medical history of Breast granulomatous mastitis and Hypertension.   Past Surgical History:  has a past surgical history that includes sebaceous cyst excision (2015).  Family History: family history includes Diabetes in her mother.  Social History:  reports that she has never smoked. She has never used smokeless tobacco. She reports that she does not drink alcohol or use drugs.  Current Medications: Includes daily prednisone dosage secondary to the granulomatous mastitis  Allergies:  Allergies as of 10/09/2018  . (No Known Allergies)    ROS:  General: Denies weight loss, weight gain, fatigue, fevers, chills, and night sweats. Eyes: Denies blurry vision, double vision, eye pain, itchy eyes, and tearing. Ears: Denies hearing loss, earache, and ringing in ears. Nose: Denies sinus pain, congestion, infections, runny nose, and nosebleeds. Mouth/throat: Denies hoarseness, sore throat, bleeding gums, and difficulty swallowing. Heart: Denies chest pain, palpitations, racing heart, irregular heartbeat, leg pain or swelling, and decreased activity tolerance. Respiratory: Denies breathing difficulty, shortness of breath, wheezing, cough, and sputum. GI: Denies change in appetite, heartburn, constipation, diarrhea, and blood in stool. GU: Denies difficulty urinating, pain with urinating, urgency, frequency, blood in urine. Musculoskeletal: Denies joint stiffness, pain,  swelling, muscle weakness. Skin: Denies rash, itching, mass, tumors, sores, and boils Neurologic: Denies headache, fainting, dizziness, seizures, numbness, and tingling. Psychiatric: Denies depression, anxiety, difficulty sleeping, and memory loss. Endocrine: Denies heat or cold intolerance, and increased thirst or urination. Blood/lymph: Denies easy bruising, easy bruising, and swollen glands     Objective:     BP 126/88   Pulse 75   Temp 97.7 F (36.5 C) (Oral)   Resp (!) 21   Ht 5\' 2"  (1.575 m)   Wt 73.5 kg   LMP 12/03/2017   SpO2 100%   Breastfeeding No   BMI 29.63 kg/m    Constitutional :  alert, cooperative, appears stated age and no distress  Lymphatics/Throat:  no asymmetry, masses, or scars  Respiratory:  clear to auscultation bilaterally  Cardiovascular:  regular rate and rhythm  Gastrointestinal: soft, non-tender; bowel sounds normal; no masses,  no organomegaly.   Musculoskeletal: Steady gait and movement  Skin: Cool and moist  Psychiatric: Normal affect, non-agitated, not confused       LABS:  CMP Latest Ref Rng & Units 10/09/2018 03/08/2018 08/29/2017  Glucose 70 - 99 mg/dL 95 388(I) 757(V)  BUN 6 - 20 mg/dL 15 6 10   Creatinine 0.44 - 1.00 mg/dL 7.28 2.06(O) 1.56  Sodium 135 - 145 mmol/L 138 136 134(L)  Potassium 3.5 - 5.1 mmol/L 3.0(L) 3.8 4.2  Chloride 98 - 111 mmol/L 100 107 103  CO2 22 - 32 mmol/L 27 23 21(L)  Calcium 8.9 - 10.3 mg/dL 1.5(P) 7.9(K) 8.9  Total Protein 6.5 - 8.1 g/dL 7.0 6.5 3.2(X)  Total Bilirubin 0.3 - 1.2 mg/dL 0.7 0.5 0.6  Alkaline Phos 38 - 126 U/L 85 53 115  AST 15 - 41 U/L 79(H)  19 30  ALT 0 - 44 U/L 54(H) 21 25   CBC Latest Ref Rng & Units 10/09/2018 03/08/2018 08/29/2017  WBC 4.0 - 10.5 K/uL 15.5(H) 7.7 24.2(H)  Hemoglobin 12.0 - 15.0 g/dL 34.9 17.9 15.0  Hematocrit 36.0 - 46.0 % 40.6 34.5(L) 43.9  Platelets 150 - 400 K/uL 443(H) 300 347     RADS: CLINICAL DATA:  Abdominal pain  EXAM: ULTRASOUND ABDOMEN LIMITED RIGHT  UPPER QUADRANT  COMPARISON:  CT abdomen pelvis 08/29/2017  FINDINGS: Gallbladder:  Cholelithiasis with stones measuring up to 6 mm. A negative sonographic Eulah Pont sign was reported by the sonographer. No wall thickening or pericholecystic fluid.  Common bile duct:  Diameter: 4 mm  Liver:  No focal lesion identified. Within normal limits in parenchymal echogenicity. Portal vein is patent on color Doppler imaging with normal direction of blood flow towards the liver.  IMPRESSION: Cholelithiasis without other evidence of acute cholecystitis.   Electronically Signed   By: Deatra Robinson M.D.   On: 10/09/2018 04:51 Assessment:      Biliary colic and hypokalemia  Plan:      Discussed the risk of surgery including post-op infxn, seroma, biloma, chronic pain, poor-delayed wound healing, retained gallstone, conversion to open procedure, post-op SBO or ileus, and need for additional procedures to address said risks.  The risks of general anesthetic including MI, CVA, sudden death or even reaction to anesthetic medications also discussed. Alternatives include continued observation.  Benefits include possible symptom relief, prevention of complications including acute cholecystitis, pancreatitis.  Typical post operative recovery of 3-5 days rest, continued pain in area and incision sites, possible loose stools up to 4-6 weeks, also discussed.  The patient understands the risks, any and all questions were answered to the patient's satisfaction.  We discussed how she is an increased risk from perioperative complications secondary to her chronic steroid use.  States that she has been on the steroid for the past couple months for the granulomatous mastitis, with recent increase in dosage.  She is scheduled to follow-up with her breast surgeon in 2 days to reassess the dosage amount.  Currently the patient states the pain has completely resolved.  With the steroid usage it is hard  to determine if the leukocytosis is secondary to medications or if it is from possible acute cholecystitis.  The resolution of the pain and no obvious evidence of acute cholecystitis on ultrasound is reassuring at this time.    Due to the increased risk secondary to the steroid usage the patient requested that we hold off on any immediate surgical intervention to see if the steroids can be tapered prior to surgery.  She is interested in eventual lap chole to prevent future episodes.  We discussed ED return precautions and the possibility that this may recur while waiting to taper off the steroids.  She verbalized understanding and still wishes to defer surgery for now.  She will follow-up with me as an outpatient basis to continue to monitor symptoms and then eventually schedule her elective lap chole secondary to biliary colic.  I recommended to ED provider that she be switched over to oral potassium secondary to IV infusion intolerance for her potassium replacement prior to discharge.

## 2018-10-09 NOTE — ED Notes (Signed)
Pt PO challenged at this time.

## 2019-04-11 ENCOUNTER — Encounter: Payer: Self-pay | Admitting: Emergency Medicine

## 2019-04-11 ENCOUNTER — Emergency Department: Payer: Medicaid Other

## 2019-04-11 ENCOUNTER — Other Ambulatory Visit: Payer: Self-pay

## 2019-04-11 ENCOUNTER — Inpatient Hospital Stay
Admission: EM | Admit: 2019-04-11 | Discharge: 2019-04-16 | DRG: 418 | Disposition: A | Discharge status: Home / Self Care | Payer: Medicaid Other | Attending: Internal Medicine | Admitting: Internal Medicine

## 2019-04-11 DIAGNOSIS — I10 Essential (primary) hypertension: Secondary | ICD-10-CM | POA: Diagnosis present

## 2019-04-11 DIAGNOSIS — Z20828 Contact with and (suspected) exposure to other viral communicable diseases: Secondary | ICD-10-CM | POA: Diagnosis present

## 2019-04-11 DIAGNOSIS — K811 Chronic cholecystitis: Secondary | ICD-10-CM | POA: Diagnosis not present

## 2019-04-11 DIAGNOSIS — K859 Acute pancreatitis without necrosis or infection, unspecified: Secondary | ICD-10-CM | POA: Diagnosis present

## 2019-04-11 DIAGNOSIS — R7989 Other specified abnormal findings of blood chemistry: Secondary | ICD-10-CM

## 2019-04-11 DIAGNOSIS — R1013 Epigastric pain: Secondary | ICD-10-CM

## 2019-04-11 DIAGNOSIS — K8001 Calculus of gallbladder with acute cholecystitis with obstruction: Secondary | ICD-10-CM | POA: Diagnosis present

## 2019-04-11 DIAGNOSIS — K851 Biliary acute pancreatitis without necrosis or infection: Principal | ICD-10-CM | POA: Diagnosis present

## 2019-04-11 DIAGNOSIS — R52 Pain, unspecified: Secondary | ICD-10-CM

## 2019-04-11 DIAGNOSIS — K810 Acute cholecystitis: Secondary | ICD-10-CM | POA: Diagnosis not present

## 2019-04-11 DIAGNOSIS — R10816 Epigastric abdominal tenderness: Secondary | ICD-10-CM | POA: Diagnosis present

## 2019-04-11 LAB — URINALYSIS, COMPLETE (UACMP) WITH MICROSCOPIC
Bilirubin Urine: NEGATIVE
Glucose, UA: NEGATIVE mg/dL
Ketones, ur: 20 mg/dL — AB
Leukocytes,Ua: NEGATIVE
Nitrite: NEGATIVE
Protein, ur: NEGATIVE mg/dL
Specific Gravity, Urine: 1.009 (ref 1.005–1.030)
pH: 6 (ref 5.0–8.0)

## 2019-04-11 LAB — COMPREHENSIVE METABOLIC PANEL
ALT: 531 U/L — ABNORMAL HIGH (ref 0–44)
AST: 241 U/L — ABNORMAL HIGH (ref 15–41)
Albumin: 4.5 g/dL (ref 3.5–5.0)
Alkaline Phosphatase: 203 U/L — ABNORMAL HIGH (ref 38–126)
Anion gap: 10 (ref 5–15)
BUN: 8 mg/dL (ref 6–20)
CO2: 24 mmol/L (ref 22–32)
Calcium: 9.2 mg/dL (ref 8.9–10.3)
Chloride: 103 mmol/L (ref 98–111)
Creatinine, Ser: 0.39 mg/dL — ABNORMAL LOW (ref 0.44–1.00)
GFR calc Af Amer: >60 mL/min (ref >60)
GFR calc non Af Amer: >60 mL/min (ref >60)
Glucose, Bld: 112 mg/dL — ABNORMAL HIGH (ref 70–99)
Potassium: 4.8 mmol/L (ref 3.5–5.1)
Sodium: 137 mmol/L (ref 135–145)
Total Bilirubin: 3 mg/dL — ABNORMAL HIGH (ref 0.3–1.2)
Total Protein: 7.4 g/dL (ref 6.5–8.1)

## 2019-04-11 LAB — CBC
HCT: 42.6 % (ref 36.0–46.0)
Hemoglobin: 14.3 g/dL (ref 12.0–15.0)
MCH: 29.7 pg (ref 26.0–34.0)
MCHC: 33.6 g/dL (ref 30.0–36.0)
MCV: 88.6 fL (ref 80.0–100.0)
Platelets: 240 10*3/uL (ref 150–400)
RBC: 4.81 MIL/uL (ref 3.87–5.11)
RDW: 12.7 % (ref 11.5–15.5)
WBC: 14.5 10*3/uL — ABNORMAL HIGH (ref 4.0–10.5)
nRBC: 0 % (ref 0.0–0.2)

## 2019-04-11 LAB — LIPASE, BLOOD: Lipase: 5536 U/L — ABNORMAL HIGH (ref 11–51)

## 2019-04-11 LAB — POCT PREGNANCY, URINE: Preg Test, Ur: NEGATIVE

## 2019-04-11 MED ORDER — SODIUM CHLORIDE 0.9 % IV BOLUS
1000.0000 mL | Freq: Once | INTRAVENOUS | Status: AC
  Administered 2019-04-11: 1000 mL via INTRAVENOUS

## 2019-04-11 MED ORDER — OXYCODONE-ACETAMINOPHEN 7.5-325 MG PO TABS
2.0000 | ORAL_TABLET | Freq: Four times a day (QID) | ORAL | Status: DC | PRN
  Administered 2019-04-14 – 2019-04-15 (×2): 2 via ORAL
  Filled 2019-04-11 (×2): qty 2

## 2019-04-11 MED ORDER — MORPHINE SULFATE (PF) 2 MG/ML IV SOLN
2.0000 mg | INTRAVENOUS | Status: DC | PRN
  Administered 2019-04-11 – 2019-04-14 (×3): 2 mg via INTRAVENOUS
  Filled 2019-04-11 (×3): qty 1

## 2019-04-11 MED ORDER — HYDROMORPHONE HCL 1 MG/ML IJ SOLN
1.0000 mg | Freq: Once | INTRAMUSCULAR | Status: AC
  Administered 2019-04-11: 1 mg via INTRAVENOUS
  Filled 2019-04-11: qty 1

## 2019-04-11 MED ORDER — ONDANSETRON HCL 4 MG PO TABS: 4.0000 mg | ORAL_TABLET | Freq: Four times a day (QID) | ORAL | Status: DC | PRN

## 2019-04-11 MED ORDER — PIPERACILLIN-TAZOBACTAM 3.375 G IVPB 30 MIN
3.3750 g | Freq: Once | INTRAVENOUS | Status: AC
  Administered 2019-04-11: 3.375 g via INTRAVENOUS
  Filled 2019-04-11: qty 50

## 2019-04-11 MED ORDER — FENTANYL CITRATE (PF) 100 MCG/2ML IJ SOLN
50.0000 ug | INTRAMUSCULAR | Status: DC | PRN
  Administered 2019-04-11: 50 ug via INTRAVENOUS
  Filled 2019-04-11: qty 2

## 2019-04-11 MED ORDER — ONDANSETRON HCL 4 MG/2ML IJ SOLN
4.0000 mg | Freq: Once | INTRAMUSCULAR | Status: AC
  Administered 2019-04-11: 4 mg via INTRAVENOUS
  Filled 2019-04-11: qty 2

## 2019-04-11 MED ORDER — PROMETHAZINE HCL 25 MG/ML IJ SOLN
25.0000 mg | Freq: Once | INTRAMUSCULAR | Status: AC
  Administered 2019-04-11: 25 mg via INTRAVENOUS
  Filled 2019-04-11: qty 1

## 2019-04-11 MED ORDER — SODIUM CHLORIDE 0.9% FLUSH
3.0000 mL | Freq: Once | INTRAVENOUS | Status: AC
  Administered 2019-04-11: 3 mL via INTRAVENOUS

## 2019-04-11 MED ORDER — SODIUM CHLORIDE 0.9 % IV SOLN
INTRAVENOUS | Status: DC
  Administered 2019-04-11 – 2019-04-14 (×6): via INTRAVENOUS

## 2019-04-11 MED ORDER — ONDANSETRON HCL 4 MG/2ML IJ SOLN
4.0000 mg | Freq: Four times a day (QID) | INTRAMUSCULAR | Status: DC | PRN
  Administered 2019-04-14 (×2): 4 mg via INTRAVENOUS
  Filled 2019-04-11: qty 2

## 2019-04-11 NOTE — H&P (Addendum)
[facility] NAME: Kendra Santos    MR#:  [id number]  DATE OF BIRTH:  [birth year]  DATE OF ADMISSION:  04/11/2019  PRIMARY CARE PHYSICIAN: Department, [facility]   REQUESTING/REFERRING PHYSICIAN: Brenton Grills, MD  CHIEF COMPLAINT:   Chief Complaint  Patient presents with  . Abdominal Pain    HISTORY OF PRESENT ILLNESS:  28 y.o. female with pertinent past medical history of hypertension and depressionPresenting to the ED with chief complaints of severe sharp epigastric abdominal pain radiating to the back associated with nausea and vomiting.  Patient report onset of symptoms for the past 3 days with worsening symptoms today. Describes episode of severe nausea and vomiting and inability to keep anything down. Patient denied any history of pancreatitis, consuming alcohol, or illegal drug abuse. Denies recent new medication or history of abdominal trauma.  Denies fevers or chills, chest pain, shortness of breath, diarrhea or any other GI related symptoms.  On arrival to the ED, she was afebrile, heart rate of 81 bpm, and blood pressure 132/8 mm Hg. She was in severe distress due to pain with severe epigastric tenderness.  Initial labs revealed lipase of 5500, glucose 112, AST 241, ALT 531, alk phos 203, bilirubin 3.0, WBC 14.5.  UA showed presence of ketones otherwise negative for UTI.  Ultrasound of the right upper quadrant showed findings suspicious for acute cholecystitis.  Patient was kept n.p.o., treated with IV fluids, analgesics and antiemetics.  Findings discussed with GI and general surgery who will evaluate patient in a.m.   PAST MEDICAL HISTORY:   Past Medical History:  Diagnosis Date  . Breast abscess   . Hypertension     PAST SURGICAL HISTORY:   Past Surgical History:  Procedure Laterality Date  . sebaceous cyst excision  2015   Back    SOCIAL HISTORY:   Social History   Tobacco Use  .  Smoking status: Never Smoker  . Smokeless tobacco: Never Used  Substance Use Topics  . Alcohol use: No    FAMILY HISTORY:   Family History  Problem Relation Age of Onset  . Diabetes Mother     DRUG ALLERGIES:  No Known Allergies  REVIEW OF SYSTEMS:   Review of Systems  Constitutional: Negative for chills, fever, malaise/fatigue and weight loss.  HENT: Negative for congestion, hearing loss and sore throat.   Eyes: Negative for blurred vision and double vision.  Respiratory: Negative for cough, shortness of breath and wheezing.   Cardiovascular: Negative for chest pain, palpitations, orthopnea and leg swelling.  Gastrointestinal: Positive for abdominal pain, constipation, nausea and vomiting. Negative for diarrhea.  Genitourinary: Negative for dysuria and urgency.  Musculoskeletal: Negative for myalgias.  Skin: Negative for rash.       Yellowing discoloration of skin  Neurological: Negative for dizziness, sensory change, speech change, focal weakness and headaches.  Psychiatric/Behavioral: Negative for depression.    MEDICATIONS AT HOME:   Prior to Admission medications   Not on File      VITAL SIGNS:  Blood pressure (!) 136/93, pulse 73, temperature 98.7 F (37.1 C), temperature source Oral, resp. rate 20, height _0  (1.575 m), weight 84.1 kg, last menstrual period 03/12/2019, SpO2 98 %.  PHYSICAL EXAMINATION:   Physical Exam  GENERAL:  28 y.o.-year-old patient lying in the bed with no acute distress.  EYES: Pupils equal, round, reactive to light and accommodation.  Positive scleral icterus. Extraocular muscles intact.  HEENT: Head  atraumatic, normocephalic. Oropharynx and nasopharynx clear.  NECK:  Supple, no jugular venous distention. No thyroid enlargement, no tenderness.  LUNGS: Normal breath sounds bilaterally, no wheezing, rales,rhonchi or crepitation. No use of accessory muscles of respiration.  CARDIOVASCULAR: S1, S2 normal. No murmurs, rubs, or gallops.   ABDOMEN: Positive Murphy's sign. Soft nondistended. Bowel sounds present. No organomegaly or mass.  EXTREMITIES: No pedal edema, cyanosis, or clubbing. No rash or lesions. + pedal pulses MUSCULOSKELETAL: Normal bulk, and power was 5+ grip and elbow, knee, and ankle flexion and extension bilaterally.  NEUROLOGIC:Alert and oriented x 3. CN 2-12 intact. Sensation to light touch and cold stimuli intact bilaterally. DTR's (biceps, patellar, and achilles) 2+ and symmetric throughout. Gait not tested due to safety concern. PSYCHIATRIC: The patient is alert and oriented x 3.  SKIN: Jaundiced  DATA REVIEWED:  LABORATORY PANEL:   CBC Recent Labs  Lab 04/11/19 1906  WBC 14.5*  HGB 14.3  HCT 42.6  PLT 240   ------------------------------------------------------------------------------------------------------------------  Chemistries  Recent Labs  Lab 04/11/19 1906  NA 137  K 4.8  CL 103  CO2 24  GLUCOSE 112*  BUN 8  CREATININE 0.39*  CALCIUM 9.2  AST 241*  ALT 531*  ALKPHOS 203*  BILITOT 3.0*   ------------------------------------------------------------------------------------------------------------------  Cardiac Enzymes No results for input(s): TROPONINI in the last 168 hours. ------------------------------------------------------------------------------------------------------------------  RADIOLOGY:  US Abdomen Limited Ruq  Result Date: 04/11/2019 CLINICAL DATA:  Epigastric pain for 3 days EXAM: ULTRASOUND ABDOMEN LIMITED RIGHT UPPER QUADRANT COMPARISON:  October 09, 2018 FINDINGS: Gallbladder: Gallstones are identified. The gallbladder wall measures 5 mm. The ultrasound technologist reports positive sonographic Murphy sign. Common bile duct: Diameter: 4.1 mm Liver: No focal lesion identified. Within normal limits in parenchymal echogenicity. Portal vein is patent on color Doppler imaging with normal direction of blood flow towards the liver. Other: None. IMPRESSION: Findings  suspicious for acute cholecystitis. Electronically Signed   By: Abelardo Diesel M.D.   On: 04/11/2019 20:22    EKG:  EKG: there are no previous tracings available for comparison.  IMPRESSION AND PLAN:   28 y.o. female with pertinent past medical history of hypertension and depression presenting to the ED with chief complaints of severe sharp epigastric abdominal pain radiating to the back associated with nausea and vomiting.  1. Acute gallstone pancreatitis -patient presenting with abdominal pain, nausea vomiting.  Noted to have elevated lipase 5500, elevated LFTs and bilirubin - Admit to MedSurg unit - Check lipid panel to exclude hypertriglyceridemia - Continue to trend lipase and LFTs - Consider further imaging (CT abdomen/pelvis w/ contrast) if persistent or unclear symptoms - RUQ ultrasound positive for gallstones  - NPO advance from clears as tolerated - IVF maintenance while NPO, bolus PRN until euvolemic - Morphine IV/PO PRN pain - Will get MRCP - Ondansetron or Metoclopramide PRN nausea/vomiting - General surgery and GI consult.  Message sent via [facility] to Dr. Celine Ahr and Bonna Gains. Dr Allen Norris available today for ERCP  2. Acute cholangitis/cholecystitis-patient with obstructive jaundice/icterus, right upper quadrant pain  No fever - Leukocytosis : WBC 14.5  - Monitor fever curve or signs of symptoms of ascending cholangitis - Start Empiric antibiotics with Zosyn.  3. HTN  + Goal BP <130/80 - PRN antihypertensive   4.DVT prophylaxis - Hold anti-coagulation for pending procedure  All the records are reviewed and case discussed with ED provider. Management plans discussed with the patient, family and they are in agreement.  CODE STATUS: FULL  TOTAL TIME TAKING CARE OF  THIS PATIENT: 50 minutes.    on 04/12/2019 at 1:58 AM  Rufina Falco, DNP, FNP-BC [facility] Nurse Practitioner Between 7am to 6pm - Pager [phone]  After 6pm go to [email or web address] - password  [facility]  Office  [phone]  CC: Primary care physician; Department, [facility]

## 2019-04-11 NOTE — ED Provider Notes (Signed)
[facility] Emergency Department Provider Note  ____________________________________________  Time seen: Approximately 8:09 PM  I have reviewed the triage vital signs and the nursing notes.   HISTORY  Chief Complaint Abdominal Pain    HPI Kendra Santos is a 28 y.o. female with a history of breast abscess and hypertension who comes the ED complaining of upper abdominal pain in the epigastrium radiating to the back for the past 3 days, associated with p.o. intolerance, worse with eating, no alleviating factors.  Associated vomiting.  No fevers chills chest pain shortness of breath.  She does report some intermittent diarrhea as well without black or bloody stool.      Past Medical History:  Diagnosis Date  . Breast abscess   . Hypertension      Patient Active Problem List   Diagnosis Date Noted  . Indication for care in labor and delivery, antepartum 08/08/2018  . Left breast abscess      Past Surgical History:  Procedure Laterality Date  . sebaceous cyst excision  2015   Back     Prior to Admission medications   Not on File     Allergies Patient has no known allergies.   Family History  Problem Relation Age of Onset  . Diabetes Mother     Social History Social History   Tobacco Use  . Smoking status: Never Smoker  . Smokeless tobacco: Never Used  Substance Use Topics  . Alcohol use: No  . Drug use: No    Review of Systems  Constitutional:   No fever or chills.  ENT:   No sore throat. No rhinorrhea. Cardiovascular:   No chest pain or syncope. Respiratory:   No dyspnea or cough. Gastrointestinal:   Positive as above for abdominal pain, vomiting and diarrhea.  Musculoskeletal:   Negative for focal pain or swelling All other systems reviewed and are negative except as documented above in ROS and HPI.  ____________________________________________   PHYSICAL EXAM:  VITAL SIGNS: ED Triage Vitals  Enc Vitals  Group     BP 04/11/19 1847 132/84     Pulse Rate 04/11/19 1847 81     Resp 04/11/19 1847 18     Temp 04/11/19 1847 98.7 F (37.1 C)     Temp Source 04/11/19 1847 Oral     SpO2 04/11/19 1847 97 %     Weight 04/11/19 1848 198 lb (89.8 kg)     Height 04/11/19 1848 5\' 2"  (1.575 m)     Head Circumference --      Peak Flow --      Pain Score 04/11/19 1847 8     Pain Loc --      Pain Edu? --      Excl. in GC? --     Vital signs reviewed, nursing assessments reviewed.   Constitutional:   Alert and oriented. Non-toxic appearance. Eyes:   Conjunctivae are normal. EOMI. PERRL. ENT      Head:   Normocephalic and atraumatic.         Neck:   No meningismus. Full ROM. Hematological/Lymphatic/Immunilogical:   No cervical lymphadenopathy. Cardiovascular:   RRR. Symmetric bilateral radial and DP pulses.  No murmurs. Cap refill less than 2 seconds. Respiratory:   Normal respiratory effort without tachypnea/retractions. Breath sounds are clear and equal bilaterally. No wheezes/rales/rhonchi. Gastrointestinal:   Soft with pronounced epigastric tenderness. Non distended.  No rebound, rigidity, or guarding.  Musculoskeletal:   Normal range of motion in all extremities.  No joint effusions.  No lower extremity tenderness.  No edema. Neurologic:   Normal speech and language.  Motor grossly intact. No acute focal neurologic deficits are appreciated.  Skin:    Skin is warm, dry and intact. No rash noted.  No petechiae, purpura, or bullae.  ____________________________________________    LABS (pertinent positives/negatives) (all labs ordered are listed, but only abnormal results are displayed) Labs Reviewed  LIPASE, BLOOD - Abnormal; Notable for the following components:      Result Value   Lipase 5,536 (*)    All other components within normal limits  COMPREHENSIVE METABOLIC PANEL - Abnormal; Notable for the following components:   Glucose, Bld 112 (*)    Creatinine, Ser 0.39 (*)    AST 241  (*)    ALT 531 (*)    Alkaline Phosphatase 203 (*)    Total Bilirubin 3.0 (*)    All other components within normal limits  CBC - Abnormal; Notable for the following components:   WBC 14.5 (*)    All other components within normal limits  URINALYSIS, COMPLETE (UACMP) WITH MICROSCOPIC - Abnormal; Notable for the following components:   Color, Urine YELLOW (*)    APPearance CLEAR (*)    Hgb urine dipstick SMALL (*)    Ketones, ur 20 (*)    Bacteria, UA RARE (*)    All other components within normal limits  SARS CORONAVIRUS 2 (TAT 6-24 HRS)  POC URINE PREG, ED  POCT PREGNANCY, URINE   ____________________________________________   EKG    ____________________________________________    RADIOLOGY  US Abdomen Limited Ruq  Result Date: 04/11/2019 CLINICAL DATA:  Epigastric pain for 3 days EXAM: ULTRASOUND ABDOMEN LIMITED RIGHT UPPER QUADRANT COMPARISON:  October 09, 2018 FINDINGS: Gallbladder: Gallstones are identified. The gallbladder wall measures 5 mm. The ultrasound technologist reports positive sonographic Murphy sign. Common bile duct: Diameter: 4.1 mm Liver: No focal lesion identified. Within normal limits in parenchymal echogenicity. Portal vein is patent on color Doppler imaging with normal direction of blood flow towards the liver. Other: None. IMPRESSION: Findings suspicious for acute cholecystitis. Electronically Signed   By: Sherian Rein M.D.   On: 04/11/2019 20:22    ____________________________________________   PROCEDURES Procedures  ____________________________________________  DIFFERENTIAL DIAGNOSIS   Cholecystitis, pancreatitis, choledocholithiasis, bowel obstruction, GERD, peptic ulcer disease  CLINICAL IMPRESSION / ASSESSMENT AND PLAN / ED COURSE  Medications ordered in the ED: Medications  fentaNYL (SUBLIMAZE) injection 50 mcg (50 mcg Intravenous Given 04/11/19 1925)  piperacillin-tazobactam (ZOSYN) IVPB 3.375 g (3.375 g Intravenous New Bag/Given  04/11/19 2055)  sodium chloride flush (NS) 0.9 % injection 3 mL (3 mLs Intravenous Given 04/11/19 1921)  sodium chloride 0.9 % bolus 1,000 mL (1,000 mLs Intravenous New Bag/Given 04/11/19 1921)  ondansetron (ZOFRAN) injection 4 mg (4 mg Intravenous Given 04/11/19 1955)  HYDROmorphone (DILAUDID) injection 1 mg (1 mg Intravenous Given 04/11/19 1955)  promethazine (PHENERGAN) injection 25 mg (25 mg Intravenous Given 04/11/19 2031)    Pertinent labs & imaging results that were available during my care of the patient were reviewed by me and considered in my medical decision making (see chart for details).  Kendra Santos was evaluated in Emergency Department on 04/11/2019 for the symptoms described in the history of present illness. She was evaluated in the context of the global COVID-19 pandemic, which necessitated consideration that the patient might be at risk for infection with the SARS-CoV-2 virus that causes COVID-19. Institutional protocols and algorithms that pertain to the evaluation  of patients at risk for COVID-19 are in a state of rapid change based on information released by regulatory bodies including the CDC and federal and state organizations. These policies and algorithms were followed during the patient's care in the ED.   Patient presents with upper abdominal pain and tenderness.  Vital signs are normal.  White count is 14,000.  Will get an ultrasound.  Chemistry still pending.  We will give 4 mg IV Zofran and 1 mg IV Dilaudid for pain and nausea control.  ----------------------------------------- 8:10 PM on 04/11/2019 -----------------------------------------  Chemistry shows elevated LFTs including T bili of 3.0.  Lipase still pending.    Clinical Course as of Apr 10 2108  Tue Apr 11, 2019  2054 Lipase 5500.  Discussed with GI Dr. Bonna Gains who confirms Dr. Allen Norris is available tomorrow for ERCP.  Because ultrasound does not show acute dilated CBD they suggest getting an MRCP to  further evaluate for the necessity of ERCP.  Discussed with Dr. Celine Ahr of surgery who agrees with antibiotic coverage for now, medical management and GI work-up.  Surgery for ccy will need to be delayed until pancreatitis is improved.   [PS]    Clinical Course User Index [PS] Carrie Mew, MD     ----------------------------------------- 9:08 PM on 04/11/2019 -----------------------------------------  Discussed with hospitalist to admit  ____________________________________________   FINAL CLINICAL IMPRESSION(S) / ED DIAGNOSES    Final diagnoses:  Epigastric pain  Acute cholecystitis  Acute pancreatitis, unspecified complication status, unspecified pancreatitis type  Elevated LFTs     ED Discharge Orders    None      Portions of this note were generated with dragon dictation software. Dictation errors may occur despite best attempts at proofreading.   Carrie Mew, MD 04/11/19 2109

## 2019-04-11 NOTE — ED Notes (Signed)
US at bedside

## 2019-04-11 NOTE — ED Notes (Signed)
ED TO INPATIENT HANDOFF REPORT  ED Nurse Name and Phone #: Bilaal Leib [phone]  S Name/Age/Gender Kendra Santos 28 y.o. female Room/Bed: ED07A/ED07A  Code Status   Code Status: Full Code  Home/SNF/Other Home Patient oriented to: self, place, time and situation Is this baseline? Yes   Triage Complete: Triage complete  Chief Complaint Abd Pain; diarrhea  Triage Note Pt via pov from home with abdominal and back pain x 3 days. She states she has had some constipation and that she has been unable to keep food or drink down for 2 days. Pt alert & oriented, rocking in triage chair.    Allergies No Known Allergies  Level of Care/Admitting Diagnosis ED Disposition    ED Disposition Condition [facility] Area: [place] [[id number]]  Level of Care: Med-Surg [16]  Covid Evaluation: Asymptomatic Screening Protocol (No Symptoms)  Diagnosis: Acute pancreatitis [577.0.ICD-9-CM]  Admitting Physician: Rufina Falco McCook  Attending Physician: Rufina Falco ACHIENG [[id number]]  Estimated length of stay: past midnight tomorrow  Certification:: I certify this patient will need inpatient services for at least 2 midnights  PT Class (Do Not Modify): Inpatient [101]  PT Acc Code (Do Not Modify): Private [1]       B Medical/Surgery History Past Medical History:  Diagnosis Date  . Breast abscess   . Hypertension    Past Surgical History:  Procedure Laterality Date  . sebaceous cyst excision  2015   Back     A IV Location/Drains/Wounds Patient Lines/Drains/Airways Status   Active Line/Drains/Airways    Name:   Placement date:   Placement time:   Site:   Days:   Peripheral IV 10/09/18 Right Antecubital   10/09/18    0331    Antecubital   184   Peripheral IV 04/11/19 Right Antecubital   04/11/19    1920    Antecubital   less than 1          Intake/Output Last 24 hours  Intake/Output Summary (Last 24 hours) at 04/11/2019 2259 Last  data filed at 04/11/2019 2223 Gross per 24 hour  Intake 1000 ml  Output -  Net 1000 ml    Labs/Imaging Results for orders placed or performed during the hospital encounter of 04/11/19 (from the past 48 hour(s))  Lipase, blood     Status: Abnormal   Collection Time: 04/11/19  7:06 PM  Result Value Ref Range   Lipase 5,536 (H) 11 - 51 U/L    Comment: RESULT CONFIRMED BY MANUAL DILUTION Performed at [facility], [address] [place], [place] [address]   Comprehensive metabolic panel     Status: Abnormal   Collection Time: 04/11/19  7:06 PM  Result Value Ref Range   Sodium 137 135 - 145 mmol/L   Potassium 4.8 3.5 - 5.1 mmol/L    Comment: HEMOLYSIS AT THIS LEVEL MAY AFFECT RESULT   Chloride 103 98 - 111 mmol/L   CO2 24 22 - 32 mmol/L   Glucose, Bld 112 (H) 70 - 99 mg/dL   BUN 8 6 - 20 mg/dL   Creatinine, Ser 0.39 (L) 0.44 - 1.00 mg/dL   Calcium 9.2 8.9 - 10.3 mg/dL   Total Protein 7.4 6.5 - 8.1 g/dL   Albumin 4.5 3.5 - 5.0 g/dL   AST 241 (H) 15 - 41 U/L    Comment: HEMOLYSIS AT THIS LEVEL MAY AFFECT RESULT   ALT 531 (H) 0 - 44 U/L   Alkaline  Phosphatase 203 (H) 38 - 126 U/L   Total Bilirubin 3.0 (H) 0.3 - 1.2 mg/dL    Comment: HEMOLYSIS AT THIS LEVEL MAY AFFECT RESULT   GFR calc non Af Amer >60 >60 mL/min   GFR calc Af Amer >60 >60 mL/min   Anion gap 10 5 - 15    Comment: Performed at [facility], [address] [place], [place] [address]  CBC     Status: Abnormal   Collection Time: 04/11/19  7:06 PM  Result Value Ref Range   WBC 14.5 (H) 4.0 - 10.5 K/uL   RBC 4.81 3.87 - 5.11 MIL/uL   Hemoglobin 14.3 12.0 - 15.0 g/dL   HCT 60.442.6 54.036.0 - 98.146.0 %   MCV 88.6 80.0 - 100.0 fL   MCH 29.7 26.0 - 34.0 pg   MCHC 33.6 30.0 - 36.0 g/dL   RDW 19.112.7 47.811.5 - 29.515.5 %   Platelets 240 150 - 400 K/uL   nRBC 0.0 0.0 - 0.2 %    Comment: Performed at [facility], [address] [place], [place] [address]  Urinalysis, Complete w Microscopic     Status:  Abnormal   Collection Time: 04/11/19  7:06 PM  Result Value Ref Range   Color, Urine YELLOW (A) YELLOW   APPearance CLEAR (A) CLEAR   Specific Gravity, Urine 1.009 1.005 - 1.030   pH 6.0 5.0 - 8.0   Glucose, UA NEGATIVE NEGATIVE mg/dL   Hgb urine dipstick SMALL (A) NEGATIVE   Bilirubin Urine NEGATIVE NEGATIVE   Ketones, ur 20 (A) NEGATIVE mg/dL   Protein, ur NEGATIVE NEGATIVE mg/dL   Nitrite NEGATIVE NEGATIVE   Leukocytes,Ua NEGATIVE NEGATIVE   RBC / HPF 0-5 0 - 5 RBC/hpf   WBC, UA 0-5 0 - 5 WBC/hpf   Bacteria, UA RARE (A) NONE SEEN   Squamous Epithelial / LPF 0-5 0 - 5   Mucus PRESENT     Comment: Performed at [facility], [address] [place], [place] [address]  Pregnancy, urine POC     Status: None   Collection Time: 04/11/19  7:10 PM  Result Value Ref Range   Preg Test, Ur NEGATIVE NEGATIVE    Comment:        THE SENSITIVITY OF THIS METHODOLOGY IS >24 mIU/mL    [place] Abdomen Limited Ruq  Result Date: 04/11/2019 CLINICAL DATA:  Epigastric pain for 3 days EXAM: ULTRASOUND ABDOMEN LIMITED RIGHT UPPER QUADRANT COMPARISON:  October 09, 2018 FINDINGS: Gallbladder: Gallstones are identified. The gallbladder wall measures 5 mm. The ultrasound technologist reports positive sonographic Murphy sign. Common bile duct: Diameter: 4.1 mm Liver: No focal lesion identified. Within normal limits in parenchymal echogenicity. Portal vein is patent on color Doppler imaging with normal direction of blood flow towards the liver. Other: None. IMPRESSION: Findings suspicious for acute cholecystitis. Electronically Signed   By: Sherian ReinWei-Chen  Lin M.D.   On: 04/11/2019 20:22    Pending Labs Unresulted Labs (From admission, onward)    Start     Ordered   04/12/19 0500  CBC  Tomorrow morning,   STAT     04/11/19 2227   04/12/19 0500  Comprehensive metabolic panel  Tomorrow morning,   STAT     04/11/19 2227   04/12/19 0500  Protime-INR  Tomorrow morning,   STAT     04/11/19 2227   04/12/19 0500   APTT  Tomorrow morning,   STAT     04/11/19 2227   04/11/19 2223  HIV antibody (Routine Testing)  Once,   STAT     04/11/19 2227   04/11/19 2055  SARS CORONAVIRUS 2 (TAT 6-24 HRS) Nasopharyngeal Nasopharyngeal Swab  (Asymptomatic/Tier 2 Patients Labs)  ONCE - STAT,   STAT    Question Answer Comment  Is this test for diagnosis or screening Screening   Symptomatic for COVID-19 as defined by CDC No   Hospitalized for COVID-19 No   Admitted to ICU for COVID-19 No   Previously tested for COVID-19 No   Resident in a congregate (group) care setting No   Employed in healthcare setting No   Pregnant No      04/11/19 2054          Vitals/Pain Today's Vitals   04/11/19 2100 04/11/19 2130 04/11/19 2200 04/11/19 2230  BP: 130/72 126/68 139/72 137/65  Pulse:    79  Resp: 18 20 15 15   Temp:      TempSrc:      SpO2:    100%  Weight:      Height:      PainSc:        Isolation Precautions No active isolations  Medications Medications  fentaNYL (SUBLIMAZE) injection 50 mcg (50 mcg Intravenous Given 04/11/19 1925)  0.9 %  sodium chloride infusion ( Intravenous New Bag/Given 04/11/19 2228)  ondansetron (ZOFRAN) tablet 4 mg (has no administration in time range)    Or  ondansetron (ZOFRAN) injection 4 mg (has no administration in time range)  morphine 2 MG/ML injection 2 mg (has no administration in time range)  oxyCODONE-acetaminophen (PERCOCET) 7.5-325 MG per tablet 2 tablet (has no administration in time range)  sodium chloride flush (NS) 0.9 % injection 3 mL (3 mLs Intravenous Given 04/11/19 1921)  sodium chloride 0.9 % bolus 1,000 mL (0 mLs Intravenous Stopped 04/11/19 2223)  ondansetron (ZOFRAN) injection 4 mg (4 mg Intravenous Given 04/11/19 1955)  HYDROmorphone (DILAUDID) injection 1 mg (1 mg Intravenous Given 04/11/19 1955)  promethazine (PHENERGAN) injection 25 mg (25 mg Intravenous Given 04/11/19 2031)  piperacillin-tazobactam (ZOSYN) IVPB 3.375 g (0 g Intravenous Stopped 04/11/19  2159)  ondansetron (ZOFRAN) injection 4 mg (4 mg Intravenous Given 04/11/19 2222)  HYDROmorphone (DILAUDID) injection 1 mg (1 mg Intravenous Given 04/11/19 2223)    Mobility walks with person assist Low fall risk   Focused Assessments pt extremely tender in abd and skin is jaundiced   R Recommendations: See Admitting Provider Note  Report given to:   Additional Notes: walk with pt, unsteady on feet either d/t pain meds or pain

## 2019-04-11 NOTE — ED Triage Notes (Signed)
Pt via pov from home with abdominal and back pain x 3 days. She states she has had some constipation and that she has been unable to keep food or drink down for 2 days. Pt alert & oriented, rocking in triage chair.

## 2019-04-12 ENCOUNTER — Encounter: Payer: Self-pay | Admitting: Anesthesiology

## 2019-04-12 ENCOUNTER — Inpatient Hospital Stay: Payer: Medicaid Other

## 2019-04-12 DIAGNOSIS — K851 Biliary acute pancreatitis without necrosis or infection: Principal | ICD-10-CM

## 2019-04-12 DIAGNOSIS — K810 Acute cholecystitis: Secondary | ICD-10-CM

## 2019-04-12 LAB — LIPASE, BLOOD: Lipase: 1282 U/L — ABNORMAL HIGH (ref 11–51)

## 2019-04-12 LAB — APTT: aPTT: 26 seconds (ref 24–36)

## 2019-04-12 LAB — COMPREHENSIVE METABOLIC PANEL
ALT: 447 U/L — ABNORMAL HIGH (ref 0–44)
AST: 195 U/L — ABNORMAL HIGH (ref 15–41)
Albumin: 3.8 g/dL (ref 3.5–5.0)
Alkaline Phosphatase: 189 U/L — ABNORMAL HIGH (ref 38–126)
Anion gap: 8 (ref 5–15)
BUN: 7 mg/dL (ref 6–20)
CO2: 23 mmol/L (ref 22–32)
Calcium: 8.2 mg/dL — ABNORMAL LOW (ref 8.9–10.3)
Chloride: 105 mmol/L (ref 98–111)
Creatinine, Ser: 0.45 mg/dL (ref 0.44–1.00)
GFR calc Af Amer: >60 mL/min (ref >60)
GFR calc non Af Amer: >60 mL/min (ref >60)
Glucose, Bld: 127 mg/dL — ABNORMAL HIGH (ref 70–99)
Potassium: 3.8 mmol/L (ref 3.5–5.1)
Sodium: 136 mmol/L (ref 135–145)
Total Bilirubin: 2.9 mg/dL — ABNORMAL HIGH (ref 0.3–1.2)
Total Protein: 6.5 g/dL (ref 6.5–8.1)

## 2019-04-12 LAB — CBC
HCT: 37.9 % (ref 36.0–46.0)
Hemoglobin: 12.9 g/dL (ref 12.0–15.0)
MCH: 30.2 pg (ref 26.0–34.0)
MCHC: 34 g/dL (ref 30.0–36.0)
MCV: 88.8 fL (ref 80.0–100.0)
Platelets: 359 10*3/uL (ref 150–400)
RBC: 4.27 MIL/uL (ref 3.87–5.11)
RDW: 12.6 % (ref 11.5–15.5)
WBC: 13.9 10*3/uL — ABNORMAL HIGH (ref 4.0–10.5)
nRBC: 0 % (ref 0.0–0.2)

## 2019-04-12 LAB — PROTIME-INR
INR: 1 (ref 0.8–1.2)
Prothrombin Time: 12.9 seconds (ref 11.4–15.2)

## 2019-04-12 LAB — SARS CORONAVIRUS 2 (TAT 6-24 HRS): SARS Coronavirus 2: NEGATIVE

## 2019-04-12 MED ORDER — GADOBUTROL 1 MMOL/ML IV SOLN
8.0000 mL | Freq: Once | INTRAVENOUS | Status: AC | PRN
  Administered 2019-04-12: 8 mL via INTRAVENOUS

## 2019-04-12 MED ORDER — PROCHLORPERAZINE EDISYLATE 10 MG/2ML IJ SOLN
5.0000 mg | Freq: Four times a day (QID) | INTRAMUSCULAR | Status: DC | PRN
  Filled 2019-04-12: qty 1

## 2019-04-12 MED ORDER — PIPERACILLIN-TAZOBACTAM 3.375 G IVPB
3.3750 g | Freq: Three times a day (TID) | INTRAVENOUS | Status: DC
  Administered 2019-04-12 – 2019-04-14 (×7): 3.375 g via INTRAVENOUS
  Filled 2019-04-12 (×7): qty 50

## 2019-04-12 NOTE — Clinical Social Work Note (Signed)
CSW acknowledges consult regarding financial assistance with medications. Unfortunately patient has insurance (Medicaid) so will be unable to provide assistance. The majority of patient's medications should only be $3.  Dayton Scrape, [place]

## 2019-04-12 NOTE — Consult Note (Addendum)
[facility] SURGICAL CONSULTATION NOTE (initial) - cpt: 16109: [id number] (Outpatient/ED)   HISTORY OF PRESENT ILLNESS (HPI):  28 y.o. female presented to [facility] ED yesterday for evaluation of abdominal pain. Patient reports a 3 days history of aching epigastric pain which radiates through to her back. The pain has been constant since onset. Pain worse with eating. Endorses associated nausea, emesis, and decreased appetite. No fever, chills, icterus, itching, darker urine, or acholic stools. She denied any history of similar pain in the past. No alcohol history. No previous abdominal surgeries. Work up in the ED was concerning for leukocytosis, elevated LFTs, hyperbilirubinemia, and elevated lipase. Her US was concerning for pericholecystic fluid and acute cholecystitis. Given concerning for gallstone pancreatitis she was admitted to medicine and MRCP was obtained with GI consult.   Surgery is consulted by emergency medicine physician Dr. Sharman CheekPhillip Stafford, MD in this context for evaluation and management of possible gallstone pancreatitis.   PAST MEDICAL HISTORY (PMH):  Past Medical History:  Diagnosis Date  . Breast abscess   . Hypertension      PAST SURGICAL HISTORY (PSH):  Past Surgical History:  Procedure Laterality Date  . sebaceous cyst excision  2015   Back     MEDICATIONS:  Prior to Admission medications   Not on File     ALLERGIES:  No Known Allergies   SOCIAL HISTORY:  Social History   Socioeconomic History  . Marital status: Married    Spouse name: Not on file  . Number of children: Not on file  . Years of education: Not on file  . Highest education level: Not on file  Occupational History  . Not on file  Social Needs  . Financial resource strain: Not on file  . Food insecurity    Worry: Not on file    Inability: Not on file  . Transportation needs    Medical: Not on file    Non-medical: Not on file  Tobacco Use  . Smoking status: Never Smoker  .  Smokeless tobacco: Never Used  Substance and Sexual Activity  . Alcohol use: No  . Drug use: No  . Sexual activity: Not on file  Lifestyle  . Physical activity    Days per week: Not on file    Minutes per session: Not on file  . Stress: Not on file  Relationships  . Social Musicianconnections    Talks on phone: Not on file    Gets together: Not on file    Attends religious service: Not on file    Active member of club or organization: Not on file    Attends meetings of clubs or organizations: Not on file    Relationship status: Not on file  . Intimate partner violence    Fear of current or ex partner: Not on file    Emotionally abused: Not on file    Physically abused: Not on file    Forced sexual activity: Not on file  Other Topics Concern  . Not on file  Social History Narrative  . Not on file     FAMILY HISTORY:  Family History  Problem Relation Age of Onset  . Diabetes Mother       REVIEW OF SYSTEMS:  Review of Systems  Constitutional: Negative for chills and fever.  HENT: Negative for congestion and sore throat.   Respiratory: Negative for cough and shortness of breath.   Cardiovascular: Negative for chest pain and palpitations.  Gastrointestinal: Positive for abdominal pain, nausea and  vomiting. Negative for constipation and diarrhea.  Genitourinary: Negative for dysuria and urgency.  Skin: Negative for itching and rash.  All other systems reviewed and are negative.   VITAL SIGNS:  Temp:  [98.7 F (37.1 C)-98.8 F (37.1 C)] 98.8 F (37.1 C) (09/16 0401) Pulse Rate:  [73-98] 82 (09/16 0401) Resp:  [14-24] 14 (09/16 0401) BP: (122-148)/(64-105) 122/73 (09/16 0401) SpO2:  [97 %-100 %] 97 % (09/16 0401) Weight:  [84.1 kg-89.8 kg] 84.1 kg (09/15 2348)     Height: 5\' 2"  (157.5 cm) Weight: 84.1 kg BMI (Calculated): 33.9   INTAKE/OUTPUT:  09/15 0701 - 09/16 0700 In: 1851.1 [I.V.:801.1; IV Piggyback:1050] Out: 50 [Emesis/NG output:50]  PHYSICAL EXAM:  Physical  Exam Vitals signs and nursing note reviewed.  Constitutional:      General: She is not in acute distress.    Appearance: She is well-developed. She is obese. She is not ill-appearing.  HENT:     Head: Normocephalic and atraumatic.  Eyes:     General: Scleral icterus (Mild) present.     Extraocular Movements: Extraocular movements intact.  Cardiovascular:     Rate and Rhythm: Normal rate and regular rhythm.     Heart sounds: Normal heart sounds. No murmur. No friction rub. No gallop.   Pulmonary:     Effort: Pulmonary effort is normal. No respiratory distress.     Breath sounds: Normal breath sounds. No wheezing or rhonchi.  Abdominal:     General: Abdomen is protuberant. There is no distension.     Palpations: Abdomen is soft.     Tenderness: There is abdominal tenderness in the right upper quadrant. There is no guarding or rebound. Positive signs include Murphy's sign.  Genitourinary:    Comments: Deferred Skin:    General: Skin is warm and dry.     Coloration: Skin is not jaundiced or pale.  Neurological:     General: No focal deficit present.     Mental Status: She is alert and oriented to person, place, and time.  Psychiatric:        Mood and Affect: Mood normal.        Behavior: Behavior normal.      Labs:  CBC Latest Ref Rng & Units 04/12/2019 04/11/2019 10/09/2018  WBC 4.0 - 10.5 K/uL 13.9(H) 14.5(H) 15.5(H)  Hemoglobin 12.0 - 15.0 g/dL 20.9 47.0 96.2  Hematocrit 36.0 - 46.0 % 37.9 42.6 40.6  Platelets 150 - 400 K/uL 359 240 443(H)   CMP Latest Ref Rng & Units 04/12/2019 04/11/2019 10/09/2018  Glucose 70 - 99 mg/dL 836(O) 294(T) 95  BUN 6 - 20 mg/dL 7 8 15   Creatinine 0.44 - 1.00 mg/dL 6.54 6.50(P) 5.46  Sodium 135 - 145 mmol/L 136 137 138  Potassium 3.5 - 5.1 mmol/L 3.8 4.8 3.0(L)  Chloride 98 - 111 mmol/L 105 103 100  CO2 22 - 32 mmol/L 23 24 27   Calcium 8.9 - 10.3 mg/dL 8.2(L) 9.2 8.8(L)  Total Protein 6.5 - 8.1 g/dL 6.5 7.4 7.0  Total Bilirubin 0.3 - 1.2 mg/dL  2.9(H) 3.0(H) 0.7  Alkaline Phos 38 - 126 U/L 189(H) 203(H) 85  AST 15 - 41 U/L 195(H) 241(H) 79(H)  ALT 0 - 44 U/L 447(H) 531(H) 54(H)     Imaging studies:   RUQ [place] (04/11/2019) personally reviewed showing small gallstones and pericholecystic fluid, and radiologist report reviewed:  IMPRESSION: Findings suspicious for acute cholecystitis.  MRCP (04/12/2019) personally reviewed which does not show retained CBD stone but there  is significant pericholecystic fluid, and radiologist report reviewed below:  IMPRESSION: 1. Gallbladder wall thickening with pericholecystic fluid, at least 2 small gallstones, and faintly accentuated enhancement to the hepatic parenchyma adjacent to the gallbladder on arterial phase images. The appearance raises suspicion for acute cholecystitis. 2. No choledocholithiasis is identified. Very faintly accentuated enhancement of the wall of the distal CBD but with normal expected conical tapering approaching the ampulla. 3. Acute pancreatitis with peripancreatic edema and potentially some mild secondary inflammation of the descending duodenum. No findings of pancreatic abscess, necrosis, or pseudocyst. 4. Mild periportal edema.    Assessment/Plan: (ICD-10's: K85.10, K81.0) 28 y.o. female with with likely gallstone pancreatitis and acute cholecystitis and has most likely passed gallstone given clinical improvement, decreased lipase, and negative MRCP   - Admit to medicine  - Agree with GI consult, although with negative MRCP and improving labs, most likely passed gallstone  - NPO + IVF until pain improves   - IV ABx   - pain control prn  - monitor abdominal examination  - trend LFTs, Lipase, CBC  - During this hospitalization, patient will benefit from laparoscopic cholecystectomy once inflammation/swelling and labs improved. Timing per Dr Celine Ahr   - Further management per primary team   All of the above findings and recommendations were discussed with  the patient, and all of patient's questions were answered to her expressed satisfaction.  Thank you for the opportunity to participate in this patient's care.   -- Edison Simon, PA-C [facility] 04/12/2019, 9:23 AM [phone] M-F: 7am - 4pm  I saw and evaluated the patient.  I agree with the above documentation, exam, and plan, which I have edited where appropriate. Fredirick Maudlin  12:02 PM

## 2019-04-12 NOTE — Progress Notes (Signed)
[employer] [facility] -  at [facility]                                                                                                                                                                                  Patient Demographics   Kendra Santos, is a 28 y.o. female, DOB - 1990-08-07, ZOX:[id number]  Admit date - 04/11/2019   Admitting Physician Jimmye NormanElizabeth Achieng Ouma, NP  Outpatient Primary MD for the patient is Department, [facility]   LOS - 1  Subjective: Pt states pain in control     Review of Systems:   CONSTITUTIONAL: No documented fever. No fatigue, weakness. No weight gain, no weight loss.  EYES: No blurry or double vision.  ENT: No tinnitus. No postnasal drip. No redness of the oropharynx.  RESPIRATORY: No cough, no wheeze, no hemoptysis. No dyspnea.  CARDIOVASCULAR: No chest pain. No orthopnea. No palpitations. No syncope.  GASTROINTESTINAL: No nausea, no vomiting or diarrhea. No abdominal pain. No melena or hematochezia.  GENITOURINARY: No dysuria or hematuria.  ENDOCRINE: No polyuria or nocturia. No heat or cold intolerance.  HEMATOLOGY: No anemia. No bruising. No bleeding.  INTEGUMENTARY: No rashes. No lesions.  MUSCULOSKELETAL: No arthritis. No swelling. No gout.  NEUROLOGIC: No numbness, tingling, or ataxia. No seizure-type activity.  PSYCHIATRIC: No anxiety. No insomnia. No ADD.    Vitals:   Vitals:   04/11/19 2300 04/11/19 2330 04/11/19 2348 04/12/19 0401  BP: 135/64 (!) 148/73 (!) 136/93 122/73  Pulse: 76 98 73 82  Resp: 18 (!) 22 20 14   Temp:    98.8 F (37.1 C)  TempSrc:   Oral Oral  SpO2: 98% 97% 98% 97%  Weight:   84.1 kg   Height:   5\' 2"  (1.575 m)     Wt Readings from Last 3 Encounters:  04/11/19 84.1 kg  10/09/18 73.5 kg  08/08/18 78 kg     Intake/Output Summary (Last 24 hours) at 04/12/2019 1317 Last data filed at 04/12/2019 0600 Gross per 24 hour  Intake 1851.1 ml  Output 50 ml  Net 1801.1 ml     Physical Exam:   GENERAL: Pleasant-appearing in no apparent distress.  HEAD, EYES, EARS, NOSE AND THROAT: Atraumatic, normocephalic. Extraocular muscles are intact. Pupils equal and reactive to light. Sclerae anicteric. No conjunctival injection. No oro-pharyngeal erythema.  NECK: Supple. There is no jugular venous distention. No bruits, no lymphadenopathy, no thyromegaly.  HEART: Regular rate and rhythm,. No murmurs, no rubs, no clicks.  LUNGS: Clear to auscultation bilaterally. No rales or rhonchi. No wheezes.  ABDOMEN: Soft, flat, + Abdomen tenderness  Has good bowel sounds. No hepatosplenomegaly appreciated.  EXTREMITIES: No  evidence of any cyanosis, clubbing, or peripheral edema.  +2 pedal and radial pulses bilaterally.  NEUROLOGIC: The patient is alert, awake, and oriented x3 with no focal motor or sensory deficits appreciated bilaterally.  SKIN: Moist and warm with no rashes appreciated.  Psych: Not anxious, depressed LN: No inguinal LN enlargement    Antibiotics   Anti-infectives (From admission, onward)   Start     Dose/Rate Route Frequency Ordered Stop   04/12/19 0600  piperacillin-tazobactam (ZOSYN) IVPB 3.375 g     3.375 g 12.5 mL/hr over 240 Minutes Intravenous Every 8 hours 04/12/19 0242     04/11/19 2045  piperacillin-tazobactam (ZOSYN) IVPB 3.375 g     3.375 g 100 mL/hr over 30 Minutes Intravenous  Once 04/11/19 2040 04/11/19 2159      Medications   Scheduled Meds: Continuous Infusions: . sodium chloride 125 mL/hr at 04/12/19 0735  . piperacillin-tazobactam (ZOSYN)  IV 3.375 g (04/12/19 0529)   PRN Meds:.fentaNYL (SUBLIMAZE) injection, morphine injection, ondansetron **OR** ondansetron (ZOFRAN) IV, oxyCODONE-acetaminophen, prochlorperazine   Data Review:   Micro Results Recent Results (from the past 240 hour(s))  SARS CORONAVIRUS 2 (TAT 6-24 HRS) Nasopharyngeal Nasopharyngeal Swab     Status: None   Collection Time: 04/11/19  8:57 PM   Specimen:  Nasopharyngeal Swab  Result Value Ref Range Status   SARS Coronavirus 2 NEGATIVE NEGATIVE Final    Comment: (NOTE) SARS-CoV-2 target nucleic acids are NOT DETECTED. The SARS-CoV-2 RNA is generally detectable in upper and lower respiratory specimens during the acute phase of infection. Negative results do not preclude SARS-CoV-2 infection, do not rule out co-infections with other pathogens, and should not be used as the sole basis for treatment or other patient management decisions. Negative results must be combined with clinical observations, patient history, and epidemiological information. The expected result is Negative. Fact Sheet for Patients: SugarRoll.be Fact Sheet for Healthcare Providers: https://www.woods-mathews.com/ This test is not yet approved or cleared by the [place] FDA and  has been authorized for detection and/or diagnosis of SARS-CoV-2 by FDA under an Emergency Use Authorization (EUA). This EUA will remain  in effect (meaning this test can be used) for the duration of the COVID-19 declaration under Section 56 4(b)(1) of the Act, 21 U.S.C. section 360bbb-3(b)(1), unless the authorization is terminated or revoked sooner. Performed at [facility], [place] [address], [place], [place] [address]     Radiology Reports Mr 3d Recon At Scanner  Result Date: 04/12/2019 CLINICAL DATA:  Abdominal pain, elevated lipase, epigastric pain. Nausea and vomiting. EXAM: MRI ABDOMEN WITHOUT AND WITH CONTRAST (INCLUDING MRCP) TECHNIQUE: Multiplanar multisequence MR imaging of the abdomen was performed both before and after the administration of intravenous contrast. Heavily T2-weighted images of the biliary and pancreatic ducts were obtained, and three-dimensional MRCP images were rendered by post processing. CONTRAST:  60mL GADAVIST GADOBUTROL 1 MMOL/ML IV SOLN COMPARISON:  Abdominal ultrasound of 04/11/2019 FINDINGS: Lower chest:  Unremarkable Hepatobiliary: Gallbladder wall thickening up to 0.7 cm in single wall thickness. Small gallstones noted in the gallbladder. Trace pericholecystic fluid. Mild periportal edema. Common bile duct measures up to 0.5 cm. No filling defect identified. No CBD irregularity. Expected distal conical tapering. Subtle accentuated enhancement in the wall of the distal common bile duct as it approaches the ampulla. Subtle accentuated enhancement in the hepatic parenchyma adjacent to the gallbladder on arterial phase post-contrast images. Pancreas: Peripancreatic edema compatible with acute pancreatitis. No abscess, pseudocyst, or pancreatic necrosis. Spleen:  Unremarkable Adrenals/Urinary Tract:  Unremarkable Stomach/Bowel: Mild secondary inflammatory findings in the descending duodenum with trace surrounding edema. Vascular/Lymphatic:  Unremarkable Other:  No supplemental non-categorized findings. Musculoskeletal: Unremarkable IMPRESSION: 1. Gallbladder wall thickening with pericholecystic fluid, at least 2 small gallstones, and faintly accentuated enhancement to the hepatic parenchyma adjacent to the gallbladder on arterial phase images. The appearance raises suspicion for acute cholecystitis. 2. No choledocholithiasis is identified. Very faintly accentuated enhancement of the wall of the distal CBD but with normal expected conical tapering approaching the ampulla. 3. Acute pancreatitis with peripancreatic edema and potentially some mild secondary inflammation of the descending duodenum. No findings of pancreatic abscess, necrosis, or pseudocyst. 4. Mild periportal edema. Electronically Signed   By: Gaylyn RongWalter  Liebkemann M.D.   On: 04/12/2019 08:00   Mr Abdomen Mrcp Vivien RossettiW Wo Contast  Result Date: 04/12/2019 CLINICAL DATA:  Abdominal pain, elevated lipase, epigastric pain. Nausea and vomiting. EXAM: MRI ABDOMEN WITHOUT AND WITH CONTRAST (INCLUDING MRCP) TECHNIQUE: Multiplanar multisequence MR imaging of the abdomen  was performed both before and after the administration of intravenous contrast. Heavily T2-weighted images of the biliary and pancreatic ducts were obtained, and three-dimensional MRCP images were rendered by post processing. CONTRAST:  8mL GADAVIST GADOBUTROL 1 MMOL/ML IV SOLN COMPARISON:  Abdominal ultrasound of 04/11/2019 FINDINGS: Lower chest: Unremarkable Hepatobiliary: Gallbladder wall thickening up to 0.7 cm in single wall thickness. Small gallstones noted in the gallbladder. Trace pericholecystic fluid. Mild periportal edema. Common bile duct measures up to 0.5 cm. No filling defect identified. No CBD irregularity. Expected distal conical tapering. Subtle accentuated enhancement in the wall of the distal common bile duct as it approaches the ampulla. Subtle accentuated enhancement in the hepatic parenchyma adjacent to the gallbladder on arterial phase post-contrast images. Pancreas: Peripancreatic edema compatible with acute pancreatitis. No abscess, pseudocyst, or pancreatic necrosis. Spleen:  Unremarkable Adrenals/Urinary Tract:  Unremarkable Stomach/Bowel: Mild secondary inflammatory findings in the descending duodenum with trace surrounding edema. Vascular/Lymphatic:  Unremarkable Other:  No supplemental non-categorized findings. Musculoskeletal: Unremarkable IMPRESSION: 1. Gallbladder wall thickening with pericholecystic fluid, at least 2 small gallstones, and faintly accentuated enhancement to the hepatic parenchyma adjacent to the gallbladder on arterial phase images. The appearance raises suspicion for acute cholecystitis. 2. No choledocholithiasis is identified. Very faintly accentuated enhancement of the wall of the distal CBD but with normal expected conical tapering approaching the ampulla. 3. Acute pancreatitis with peripancreatic edema and potentially some mild secondary inflammation of the descending duodenum. No findings of pancreatic abscess, necrosis, or pseudocyst. 4. Mild periportal  edema. Electronically Signed   By: Gaylyn RongWalter  Liebkemann M.D.   On: 04/12/2019 08:00   [place] Abdomen Limited Ruq  Result Date: 04/11/2019 CLINICAL DATA:  Epigastric pain for 3 days EXAM: ULTRASOUND ABDOMEN LIMITED RIGHT UPPER QUADRANT COMPARISON:  October 09, 2018 FINDINGS: Gallbladder: Gallstones are identified. The gallbladder wall measures 5 mm. The ultrasound technologist reports positive sonographic Murphy sign. Common bile duct: Diameter: 4.1 mm Liver: No focal lesion identified. Within normal limits in parenchymal echogenicity. Portal vein is patent on color Doppler imaging with normal direction of blood flow towards the liver. Other: None. IMPRESSION: Findings suspicious for acute cholecystitis. Electronically Signed   By: Sherian ReinWei-Chen  Lin M.D.   On: 04/11/2019 20:22     CBC Recent Labs  Lab 04/11/19 1906 04/12/19 0516  WBC 14.5* 13.9*  HGB 14.3 12.9  HCT 42.6 37.9  PLT 240 359  MCV 88.6 88.8  MCH 29.7 30.2  MCHC 33.6 34.0  RDW 12.7 12.6    Chemistries  Recent  Labs  Lab 04/11/19 1906 04/12/19 0516  NA 137 136  K 4.8 3.8  CL 103 105  CO2 24 23  GLUCOSE 112* 127*  BUN 8 7  CREATININE 0.39* 0.45  CALCIUM 9.2 8.2*  AST 241* 195*  ALT 531* 447*  ALKPHOS 203* 189*  BILITOT 3.0* 2.9*   ------------------------------------------------------------------------------------------------------------------ estimated creatinine clearance is 106.2 mL/min (by C-G formula based on SCr of 0.45 mg/dL). ------------------------------------------------------------------------------------------------------------------ No results for input(s): HGBA1C in the last 72 hours. ------------------------------------------------------------------------------------------------------------------ No results for input(s): CHOL, HDL, LDLCALC, TRIG, CHOLHDL, LDLDIRECT in the last 72 hours. ------------------------------------------------------------------------------------------------------------------ No  results for input(s): TSH, T4TOTAL, T3FREE, THYROIDAB in the last 72 hours.  Invalid input(s): FREET3 ------------------------------------------------------------------------------------------------------------------ No results for input(s): VITAMINB12, FOLATE, FERRITIN, TIBC, IRON, RETICCTPCT in the last 72 hours.  Coagulation profile Recent Labs  Lab 04/12/19 0516  INR 1.0    No results for input(s): DDIMER in the last 72 hours.  Cardiac Enzymes No results for input(s): CKMB, TROPONINI, MYOGLOBIN in the last 168 hours.  Invalid input(s): CK ------------------------------------------------------------------------------------------------------------------ Invalid input(s): POCBNP    Assessment & Plan   28 y.o. female with pertinent past medical history of hypertension and depression presenting to the ED with chief complaints of severe sharp epigastric abdominal pain radiating to the back associated with nausea and vomiting.  1. Acute gallstone pancreatitis - mrcp with possible shadow D/w Dr. Servando Snare plan for ERCP tomm Plan for lap choley once pancreatitis resolves.    2. Acute cholangitis/cholecystitis-patient with obstructive jaundice/icterus, right upper quadrant pain  No fever - continue Zosyn,    3. HTN  + Goal BP <130/80 - PRN antihypertensive   4.DVT prophylaxis - scd'd       Code Status Orders  (From admission, onward)         Start     Ordered   04/11/19 2224  Full code  Continuous     04/11/19 2227        Code Status History    Date Active Date Inactive Code Status Order ID Comments User Context   08/08/2018 0930 08/08/2018 1508 Full Code [id number]  Oswaldo Conroy, CNM Inpatient   Advance Care Planning Activity           Consults  Surgery and gi  DVT Prophylaxis scd's  Lab Results  Component Value Date   PLT 359 04/12/2019     Time Spent in minutes  Greater than 50% of time spent in care coordination and counseling  patient regarding the condition and plan of care.   Auburn Bilberry M.D on 04/12/2019 at 1:17 PM  Between 7am to 6pm - Pager - [phone]  After 6pm go to [email or web address] - Social research officer, government  [employer]   Office  [phone]

## 2019-04-12 NOTE — Progress Notes (Signed)
Pharmacy Antibiotic Note  Kendra Santos is a 28 y.o. female admitted on 04/11/2019 with IAI.  Pharmacy has been consulted for Zosyn dosing.  Plan: Zosyn 3.375g IV q8h (4 hour infusion).  Height: 5\' 2"  (157.5 cm) Weight: 185 lb 6.4 oz (84.1 kg) IBW/kg (Calculated) : 50.1  Temp (24hrs), Avg:98.7 F (37.1 C), Min:98.7 F (37.1 C), Max:98.7 F (37.1 C)  Recent Labs  Lab 04/11/19 1906  WBC 14.5*  CREATININE 0.39*    Estimated Creatinine Clearance: 106.2 mL/min (A) (by C-G formula based on SCr of 0.39 mg/dL (L)).    No Known Allergies  Antimicrobials this admission: Zosyn 9/15 >>   Dose adjustments this admission:  Microbiology results:  BCx:   UCx:    Sputum:    MRSA PCR:   Thank you for allowing pharmacy to be a part of this patient's care.  Hart Robinsons A 04/12/2019 2:43 AM

## 2019-04-12 NOTE — Progress Notes (Signed)
Pt. is currently off the unit to radiology.  

## 2019-04-12 NOTE — Progress Notes (Addendum)
GI courtesy note  Patient was seen and examined.  28 y/o multigravida female with 3 days of progressive abdominal pain, nausea and vomiting. Ultrasound shows cholecystitis but no choledocholithiasis and no bile duct dilation.   I was notified by this morning by Dr. Bonna Gains (on call GI from yesterday) that patient required a gastroenterology consultation, but Dr.Shreyang Posey Pronto informed me the case was already discussed with Dr. Allen Norris who reportedly tentatively plans on ERCP tomorrow.   Patient with active gallstone pancreatitis and cholecystitis. NO evidence of cholangitis despite elevated LFT's. On IV antibiotics.  MRCP negative for choledocholithiasis or biliary dilatation.  Would recommend postponing ERCP for now if feasible to avoid exacerbating pancreatitis, but will defer to Dr. Allen Norris for further recommendations.    Robet Leu, M.D. ABIM Diplomate in Gastroenterology [place]

## 2019-04-13 ENCOUNTER — Encounter
Admission: EM | Disposition: A | Discharge status: Home / Self Care | Payer: Self-pay | Source: Home / Self Care | Attending: Internal Medicine

## 2019-04-13 ENCOUNTER — Encounter: Payer: Self-pay | Admitting: Anesthesiology

## 2019-04-13 LAB — COMPREHENSIVE METABOLIC PANEL
ALT: 296 U/L — ABNORMAL HIGH (ref 0–44)
AST: 72 U/L — ABNORMAL HIGH (ref 15–41)
Albumin: 3.8 g/dL (ref 3.5–5.0)
Alkaline Phosphatase: 175 U/L — ABNORMAL HIGH (ref 38–126)
Anion gap: 9 (ref 5–15)
BUN: 9 mg/dL (ref 6–20)
CO2: 18 mmol/L — ABNORMAL LOW (ref 22–32)
Calcium: 8.2 mg/dL — ABNORMAL LOW (ref 8.9–10.3)
Chloride: 109 mmol/L (ref 98–111)
Creatinine, Ser: 0.52 mg/dL (ref 0.44–1.00)
GFR calc Af Amer: >60 mL/min (ref >60)
GFR calc non Af Amer: >60 mL/min (ref >60)
Glucose, Bld: 66 mg/dL — ABNORMAL LOW (ref 70–99)
Potassium: 3.7 mmol/L (ref 3.5–5.1)
Sodium: 136 mmol/L (ref 135–145)
Total Bilirubin: 1.3 mg/dL — ABNORMAL HIGH (ref 0.3–1.2)
Total Protein: 6.6 g/dL (ref 6.5–8.1)

## 2019-04-13 LAB — CBC
HCT: 36.1 % (ref 36.0–46.0)
Hemoglobin: 12 g/dL (ref 12.0–15.0)
MCH: 30.3 pg (ref 26.0–34.0)
MCHC: 33.2 g/dL (ref 30.0–36.0)
MCV: 91.2 fL (ref 80.0–100.0)
Platelets: 326 10*3/uL (ref 150–400)
RBC: 3.96 MIL/uL (ref 3.87–5.11)
RDW: 12.7 % (ref 11.5–15.5)
WBC: 13.7 10*3/uL — ABNORMAL HIGH (ref 4.0–10.5)
nRBC: 0 % (ref 0.0–0.2)

## 2019-04-13 LAB — HIV ANTIBODY (ROUTINE TESTING W REFLEX): HIV Screen 4th Generation wRfx: NONREACTIVE

## 2019-04-13 LAB — LIPASE, BLOOD: Lipase: 186 U/L — ABNORMAL HIGH (ref 11–51)

## 2019-04-13 SURGERY — ENDOSCOPIC RETROGRADE CHOLANGIOPANCREATOGRAPHY (ERCP) WITH PROPOFOL
Anesthesia: General

## 2019-04-13 MED ORDER — ENOXAPARIN SODIUM 40 MG/0.4ML ~~LOC~~ SOLN: 40.0000 mg | Freq: Once | SUBCUTANEOUS | Status: DC

## 2019-04-13 MED ORDER — ACETAMINOPHEN 325 MG PO TABS
650.0000 mg | ORAL_TABLET | Freq: Four times a day (QID) | ORAL | Status: DC | PRN
  Administered 2019-04-13: 650 mg via ORAL
  Filled 2019-04-13: qty 2

## 2019-04-13 MED ORDER — ACETAMINOPHEN 325 MG PO TABS: 650.0000 mg | ORAL_TABLET | Freq: Four times a day (QID) | ORAL | Status: DC | PRN

## 2019-04-13 MED ORDER — ACETAMINOPHEN 325 MG PO TABS
650.0000 mg | ORAL_TABLET | Freq: Four times a day (QID) | ORAL | Status: DC | PRN
  Administered 2019-04-13: 18:00:00 650 mg via ORAL
  Filled 2019-04-13: qty 2

## 2019-04-13 NOTE — Anesthesia Preprocedure Evaluation (Addendum)
Anesthesia Evaluation  Patient identified by MRN, date of birth, ID band Patient awake    Reviewed: Allergy & Precautions, NPO status , Patient's Chart, lab work & pertinent test results, reviewed documented beta blocker date and time   Airway Mallampati: III  TM Distance: >3 FB     Dental  (+) Chipped   Pulmonary           Cardiovascular hypertension, Pt. on medications      Neuro/Psych    GI/Hepatic   Endo/Other    Renal/GU      Musculoskeletal   Abdominal   Peds  Hematology   Anesthesia Other Findings EKG checked and ok.  Reproductive/Obstetrics                            Anesthesia Physical Anesthesia Plan  ASA: II  Anesthesia Plan: General   Post-op Pain Management:    Induction: Intravenous  PONV Risk Score and Plan:   Airway Management Planned: Oral ETT  Additional Equipment:   Intra-op Plan:   Post-operative Plan:   Informed Consent: I have reviewed the patients History and Physical, chart, labs and discussed the procedure including the risks, benefits and alternatives for the proposed anesthesia with the patient or authorized representative who has indicated his/her understanding and acceptance.       Plan Discussed with: CRNA  Anesthesia Plan Comments:         Anesthesia Quick Evaluation

## 2019-04-13 NOTE — Progress Notes (Signed)
[employer] - [place] [facility]   PATIENT NAME: Kendra Santos    MR#:  [id number]  DATE OF BIRTH:  1990-08-11  SUBJECTIVE:  CHIEF COMPLAINT:   Chief Complaint  Patient presents with  . Abdominal Pain   No new complaint this morning.  No fevers.  Abdominal pains improved.  Seen by GI and surgery today.  Noted significant improvement in lipase level.  No plans for ERCP for now.  Patient scheduled for cholecystectomy by surgery tomorrow.  REVIEW OF SYSTEMS:  ROS CONSTITUTIONAL: No documented fever. No fatigue, weakness. No weight gain, no weight loss.  EYES: No blurry or double vision.  ENT: No tinnitus. No postnasal drip. No redness of the oropharynx.  RESPIRATORY: No cough, no wheeze, no hemoptysis. No dyspnea.  CARDIOVASCULAR: No chest pain. No orthopnea. No palpitations. No syncope.  GASTROINTESTINAL: No nausea, no vomiting or diarrhea.  Abdominal pain improved GENITOURINARY: No dysuria or hematuria.  ENDOCRINE: No polyuria or nocturia. No heat or cold intolerance.  HEMATOLOGY: No anemia. No bruising. No bleeding.  INTEGUMENTARY: No rashes. No lesions.  MUSCULOSKELETAL: No arthritis. No swelling. No gout.  NEUROLOGIC: No numbness, tingling, or ataxia. No seizure-type activity.  PSYCHIATRIC: No anxiety. No insomnia. No ADD  DRUG ALLERGIES:  No Known Allergies VITALS:  Blood pressure 96/60, pulse 75, temperature 99 F (37.2 C), temperature source Oral, resp. rate 18, height 5\' 2"  (1.575 m), weight 84.1 kg, last menstrual period 03/12/2019, SpO2 99 %. PHYSICAL EXAMINATION:  Physical Exam   GENERAL: Pleasant-appearing in no apparent distress.  HEAD, EYES, EARS, NOSE AND THROAT: Atraumatic, normocephalic. Extraocular muscles are intact. Pupils equal and reactive to light. Sclerae anicteric. No conjunctival injection. No oro-pharyngeal erythema.  NECK: Supple. There is no jugular venous distention. No bruits, no lymphadenopathy, no thyromegaly.   HEART: Regular rate and rhythm,. No murmurs, no rubs, no clicks.  LUNGS: Clear to auscultation bilaterally. No rales or rhonchi. No wheezes.  ABDOMEN: Soft, flat,  right upper quadrant tenderness but no rebound or guarding.  No appreciable organomegaly.    EXTREMITIES: No evidence of any cyanosis, clubbing, or peripheral edema.  +2 pedal and radial pulses bilaterally.  NEUROLOGIC: The patient is alert, awake, and oriented x3 with no focal motor or sensory deficits appreciated bilaterally.  SKIN: Moist and warm with no rashes appreciated.  Psych: Not anxious, depressed LN: No inguinal LN enlargement  LABORATORY PANEL:  Female CBC Recent Labs  Lab 04/13/19 0543  WBC 13.7*  HGB 12.0  HCT 36.1  PLT 326   ------------------------------------------------------------------------------------------------------------------ Chemistries  Recent Labs  Lab 04/13/19 0543  NA 136  K 3.7  CL 109  CO2 18*  GLUCOSE 66*  BUN 9  CREATININE 0.52  CALCIUM 8.2*  AST 72*  ALT 296*  ALKPHOS 175*  BILITOT 1.3*   RADIOLOGY:  No results found. ASSESSMENT AND PLAN:    28 y.o.femalewithpertinent past medical history ofhypertension and depression presenting to the ED with chief complaints of severe sharp epigastric abdominal pain radiating to the back associated with nausea and vomiting.  1.Acute gallstone pancreatitis Patient reevaluated by gastroenterologist this morning.  MRCP negative for choledocholithiasis or biliary dilatation.  Plan is to postpone ERCP for now to avoid exacerbating pancreatitis. Patient seen by general surgeon.  Plan is for clear liquids today.  N.p.o. after midnight for laparoscopic cholecystectomy tomorrow. Pain control.  IV fluid hydration.  2.Acute cholecystitis- Patient improving clinically with IV fluids  and empiric antibiotics with IV Zosyn.   Patient  reevaluated by gastroenterologist yesterday who confirmed no evidence of cholangitis despite elevated  LFTs.  LFTs trending down. Scheduled for laparoscopic cholecystectomy tomorrow  3.HTN  Blood pressure controlled  DVT prophylaxis; ordered a dose of Lovenox today since no plans for surgery today. Plan is to resume Lovenox postop when okay with surgery. Ordered SCDs  All the records are reviewed and case discussed with Care Management/Social Worker. Management plans discussed with the patient, and she is in agreement.  CODE STATUS: Full Code  TOTAL TIME TAKING CARE OF THIS PATIENT: 35 minutes.   More than 50% of the time was spent in counseling/coordination of care: YES  POSSIBLE D/C IN 3 DAYS, DEPENDING ON CLINICAL CONDITION.   Patrece Tallie M.D on 04/13/2019 at 12:38 PM  Between 7am to 6pm - Pager - [phone]  After 6pm go to [email or web address] - Proofreader  [facility]  Office  [phone]  CC: Primary care physician; Department, [facility]  Note: This dictation was prepared with Dragon dictation along with smaller phrase technology. Any transcriptional errors that result from this process are unintentional.

## 2019-04-13 NOTE — Progress Notes (Addendum)
[facility] SURGICAL PROGRESS NOTE (cpt [phone])  Hospital Day(s): 2.   Post op day(s):  Kendra Santos   Interval History: Patient seen and examined, no acute events or new complaints overnight. Patient reports that she is feeling better this morning. Abdominal pain improved. No nausea, emesis, fever, or chills. Lipase, leukocytosis, and hyperbilirubinemia all improved. No other acute complaints.   Review of Systems:  Constitutional: denies fever, chills  HEENT: denies cough or congestion  Respiratory: denies any shortness of breath  Cardiovascular: denies chest pain or palpitations  Gastrointestinal: denies abdominal pain, N/V, or diarrhea/and bowel function as per interval history Genitourinary: denies burning with urination or urinary frequency   Vital signs in last 24 hours: [min-max] current  Temp:  [98.8 F (37.1 C)-99 F (37.2 C)] 99 F (37.2 C) (09/17 0650) Pulse Rate:  [75-87] 75 (09/17 0650) Resp:  [18] 18 (09/17 0650) BP: (96-123)/(60-78) 96/60 (09/17 0650) SpO2:  [99 %] 99 % (09/17 0650)     Height: 5\' 2"  (157.5 cm) Weight: 84.1 kg BMI (Calculated): 33.9   Intake/Output last 2 shifts:  09/16 0701 - 09/17 0700 In: 2539.5 [I.V.:2439.5; IV Piggyback:100] Out: -    Physical Exam:  Constitutional: alert, cooperative and no distress  HENT: normocephalic without obvious abnormality  Eyes: PERRL, EOM's grossly intact and symmetric, no scleral icterus  Respiratory: breathing non-labored at rest  Cardiovascular: regular rate and sinus rhythm  Gastrointestinal: soft, non-tender, and non-distended Musculoskeletal: UE and LE FROM, no edema or wounds, motor and sensation grossly intact, NT    Labs:  CBC Latest Ref Rng & Units 04/13/2019 04/12/2019 04/11/2019  WBC 4.0 - 10.5 K/uL 13.7(H) 13.9(H) 14.5(H)  Hemoglobin 12.0 - 15.0 g/dL 12.0 12.9 14.3  Hematocrit 36.0 - 46.0 % 36.1 37.9 42.6  Platelets 150 - 400 K/uL 326 359 240   CMP Latest Ref Rng & Units 04/13/2019  04/12/2019 04/11/2019  Glucose 70 - 99 mg/dL 66(L) 127(H) 112(H)  BUN 6 - 20 mg/dL 9 7 8   Creatinine 0.44 - 1.00 mg/dL 0.52 0.45 0.39(L)  Sodium 135 - 145 mmol/L 136 136 137  Potassium 3.5 - 5.1 mmol/L 3.7 3.8 4.8  Chloride 98 - 111 mmol/L 109 105 103  CO2 22 - 32 mmol/L 18(L) 23 24  Calcium 8.9 - 10.3 mg/dL 8.2(L) 8.2(L) 9.2  Total Protein 6.5 - 8.1 g/dL 6.6 6.5 7.4  Total Bilirubin 0.3 - 1.2 mg/dL 1.3(H) 2.9(H) 3.0(H)  Alkaline Phos 38 - 126 U/L 175(H) 189(H) 203(H)  AST 15 - 41 U/L 72(H) 195(H) 241(H)  ALT 0 - 44 U/L 296(H) 447(H) 531(H)    Imaging studies: No new pertinent imaging studies   Assessment/Plan: (ICD-10's: K85.10, K81.0) 28 y.o. female with improving labs and abdominal pain attributable ti gallstone pancreatitis and acute cholecystitis and has most likely passed gallstone given clinical improvement   - Clear liquids diet today; NPO after midnight  - IV ABx (Zosyn)             - pain control prn             - monitor abdominal examination             - trend LFTs, Lipase, CBC   - Will plan on laparoscopic cholecystectomy with Dr Celine Ahr tomorrow pending OR/Anesthesia availability  - All risks, benefits, and alternatives to above procedure(s) were discussed with the patient, all of her questions were answered to her expressed satisfaction, patient expresses she wishes to proceed, and informed consent was  obtained.  - Further management per primary service   All of the above findings and recommendations were discussed with the patient, and the medical team, and all of patient's questions were answered to her expressed satisfaction.  -- Lynden OxfordZachary Schulz, PA-C [facility] 04/13/2019, 7:22 AM [phone] M-F: 7am - 4pm  I saw and evaluated the patient.  I agree with the above documentation, exam, and plan, which I have edited where appropriate. Duanne GuessJennifer Amjad Fikes  11:52 AM

## 2019-04-14 ENCOUNTER — Inpatient Hospital Stay: Payer: Medicaid Other | Admitting: Anesthesiology

## 2019-04-14 ENCOUNTER — Encounter
Admission: EM | Disposition: A | Discharge status: Home / Self Care | Payer: Self-pay | Source: Home / Self Care | Attending: Internal Medicine

## 2019-04-14 ENCOUNTER — Encounter: Payer: Self-pay | Admitting: *Deleted

## 2019-04-14 DIAGNOSIS — K811 Chronic cholecystitis: Secondary | ICD-10-CM

## 2019-04-14 HISTORY — PX: CHOLECYSTECTOMY: SHX55

## 2019-04-14 LAB — CBC
HCT: 35.9 % — ABNORMAL LOW (ref 36.0–46.0)
Hemoglobin: 12.2 g/dL (ref 12.0–15.0)
MCH: 30.2 pg (ref 26.0–34.0)
MCHC: 34 g/dL (ref 30.0–36.0)
MCV: 88.9 fL (ref 80.0–100.0)
Platelets: 335 10*3/uL (ref 150–400)
RBC: 4.04 MIL/uL (ref 3.87–5.11)
RDW: 12.5 % (ref 11.5–15.5)
WBC: 11.5 10*3/uL — ABNORMAL HIGH (ref 4.0–10.5)
nRBC: 0 % (ref 0.0–0.2)

## 2019-04-14 LAB — PHOSPHORUS: Phosphorus: 2.7 mg/dL (ref 2.5–4.6)

## 2019-04-14 LAB — BASIC METABOLIC PANEL
Anion gap: 6 (ref 5–15)
BUN: <5 mg/dL — ABNORMAL LOW (ref 6–20)
CO2: 21 mmol/L — ABNORMAL LOW (ref 22–32)
Calcium: 8.3 mg/dL — ABNORMAL LOW (ref 8.9–10.3)
Chloride: 108 mmol/L (ref 98–111)
Creatinine, Ser: 0.44 mg/dL (ref 0.44–1.00)
GFR calc Af Amer: >60 mL/min (ref >60)
GFR calc non Af Amer: >60 mL/min (ref >60)
Glucose, Bld: 85 mg/dL (ref 70–99)
Potassium: 3.5 mmol/L (ref 3.5–5.1)
Sodium: 135 mmol/L (ref 135–145)

## 2019-04-14 LAB — MAGNESIUM: Magnesium: 2 mg/dL (ref 1.7–2.4)

## 2019-04-14 SURGERY — LAPAROSCOPIC CHOLECYSTECTOMY
Anesthesia: General | Site: Abdomen

## 2019-04-14 MED ORDER — SUCCINYLCHOLINE CHLORIDE 20 MG/ML IJ SOLN
INTRAMUSCULAR | Status: DC | PRN
  Administered 2019-04-14: 140 mg via INTRAVENOUS

## 2019-04-14 MED ORDER — PROPOFOL 10 MG/ML IV BOLUS
INTRAVENOUS | Status: DC | PRN
  Administered 2019-04-14: 150 mg via INTRAVENOUS

## 2019-04-14 MED ORDER — LIDOCAINE-EPINEPHRINE 1 %-1:100000 IJ SOLN
INTRAMUSCULAR | Status: AC
  Filled 2019-04-14: qty 1

## 2019-04-14 MED ORDER — KETOROLAC TROMETHAMINE 30 MG/ML IJ SOLN
INTRAMUSCULAR | Status: AC
  Filled 2019-04-14: qty 1

## 2019-04-14 MED ORDER — ROCURONIUM BROMIDE 50 MG/5ML IV SOLN
INTRAVENOUS | Status: AC
  Filled 2019-04-14: qty 1

## 2019-04-14 MED ORDER — LIDOCAINE-EPINEPHRINE 1 %-1:100000 IJ SOLN
INTRAMUSCULAR | Status: DC | PRN
  Administered 2019-04-14: 22 mL

## 2019-04-14 MED ORDER — FENTANYL CITRATE (PF) 100 MCG/2ML IJ SOLN
25.0000 ug | INTRAMUSCULAR | Status: DC | PRN
  Administered 2019-04-14 (×4): 25 ug via INTRAVENOUS

## 2019-04-14 MED ORDER — FENTANYL CITRATE (PF) 100 MCG/2ML IJ SOLN
INTRAMUSCULAR | Status: AC
  Filled 2019-04-14: qty 2

## 2019-04-14 MED ORDER — ROCURONIUM BROMIDE 100 MG/10ML IV SOLN
INTRAVENOUS | Status: DC | PRN
  Administered 2019-04-14: 10 mg via INTRAVENOUS
  Administered 2019-04-14 (×2): 5 mg via INTRAVENOUS
  Administered 2019-04-14: 15 mg via INTRAVENOUS
  Administered 2019-04-14: 5 mg via INTRAVENOUS

## 2019-04-14 MED ORDER — LIDOCAINE HCL (CARDIAC) PF 100 MG/5ML IV SOSY
PREFILLED_SYRINGE | INTRAVENOUS | Status: DC | PRN
  Administered 2019-04-14: 60 mg via INTRAVENOUS

## 2019-04-14 MED ORDER — DEXAMETHASONE SODIUM PHOSPHATE 10 MG/ML IJ SOLN
INTRAMUSCULAR | Status: AC
  Filled 2019-04-14: qty 1

## 2019-04-14 MED ORDER — DEXMEDETOMIDINE HCL 200 MCG/2ML IV SOLN
INTRAVENOUS | Status: DC | PRN
  Administered 2019-04-14 (×5): 4 ug via INTRAVENOUS

## 2019-04-14 MED ORDER — MIDAZOLAM HCL 2 MG/2ML IJ SOLN
INTRAMUSCULAR | Status: DC | PRN
  Administered 2019-04-14: 2 mg via INTRAVENOUS

## 2019-04-14 MED ORDER — ONDANSETRON HCL 4 MG/2ML IJ SOLN: 4.0000 mg | Freq: Once | INTRAMUSCULAR | Status: DC | PRN

## 2019-04-14 MED ORDER — HEMOSTATIC AGENTS (NO CHARGE) OPTIME
TOPICAL | Status: DC | PRN
  Administered 2019-04-14 (×3): 1 via TOPICAL

## 2019-04-14 MED ORDER — EVICEL 5 ML EX KIT
PACK | CUTANEOUS | Status: DC | PRN
  Administered 2019-04-14: 1

## 2019-04-14 MED ORDER — SUCCINYLCHOLINE CHLORIDE 20 MG/ML IJ SOLN
INTRAMUSCULAR | Status: AC
  Filled 2019-04-14: qty 1

## 2019-04-14 MED ORDER — SODIUM CHLORIDE 0.9 % IV SOLN
INTRAVENOUS | Status: DC
  Administered 2019-04-14 – 2019-04-16 (×5): via INTRAVENOUS

## 2019-04-14 MED ORDER — LIDOCAINE HCL (PF) 2 % IJ SOLN
INTRAMUSCULAR | Status: AC
  Filled 2019-04-14: qty 10

## 2019-04-14 MED ORDER — DEXAMETHASONE SODIUM PHOSPHATE 10 MG/ML IJ SOLN
INTRAMUSCULAR | Status: DC | PRN
  Administered 2019-04-14: 5 mg via INTRAVENOUS

## 2019-04-14 MED ORDER — BUPIVACAINE-EPINEPHRINE (PF) 0.25% -1:200000 IJ SOLN
INTRAMUSCULAR | Status: AC
  Filled 2019-04-14: qty 30

## 2019-04-14 MED ORDER — BUPIVACAINE HCL (PF) 0.25 % IJ SOLN
INTRAMUSCULAR | Status: AC
  Filled 2019-04-14: qty 30

## 2019-04-14 MED ORDER — SUGAMMADEX SODIUM 200 MG/2ML IV SOLN
INTRAVENOUS | Status: DC | PRN
  Administered 2019-04-14: 200 mg via INTRAVENOUS

## 2019-04-14 MED ORDER — FENTANYL CITRATE (PF) 100 MCG/2ML IJ SOLN
INTRAMUSCULAR | Status: DC | PRN
  Administered 2019-04-14: 100 ug via INTRAVENOUS
  Administered 2019-04-14 (×4): 25 ug via INTRAVENOUS

## 2019-04-14 MED ORDER — SUGAMMADEX SODIUM 200 MG/2ML IV SOLN
INTRAVENOUS | Status: AC
  Filled 2019-04-14: qty 4

## 2019-04-14 MED ORDER — FENTANYL CITRATE (PF) 100 MCG/2ML IJ SOLN
INTRAMUSCULAR | Status: AC
  Administered 2019-04-14: 12:00:00 25 ug via INTRAVENOUS
  Filled 2019-04-14: qty 2

## 2019-04-14 MED ORDER — ONDANSETRON HCL 4 MG/2ML IJ SOLN
INTRAMUSCULAR | Status: AC
  Filled 2019-04-14: qty 2

## 2019-04-14 MED ORDER — MIDAZOLAM HCL 2 MG/2ML IJ SOLN
INTRAMUSCULAR | Status: AC
  Filled 2019-04-14: qty 2

## 2019-04-14 MED ORDER — ACETAMINOPHEN 10 MG/ML IV SOLN
INTRAVENOUS | Status: AC
  Filled 2019-04-14: qty 100

## 2019-04-14 MED ORDER — PROPOFOL 10 MG/ML IV BOLUS
INTRAVENOUS | Status: AC
  Filled 2019-04-14: qty 20

## 2019-04-14 MED ORDER — CHLORHEXIDINE GLUCONATE CLOTH 2 % EX PADS
6.0000 | MEDICATED_PAD | Freq: Once | CUTANEOUS | Status: AC
  Administered 2019-04-14: 09:00:00 6 via TOPICAL

## 2019-04-14 MED ORDER — ACETAMINOPHEN 10 MG/ML IV SOLN
INTRAVENOUS | Status: DC | PRN
  Administered 2019-04-14: 1000 mg via INTRAVENOUS

## 2019-04-14 MED ORDER — EVICEL 5 ML EX KIT
PACK | CUTANEOUS | Status: AC
  Filled 2019-04-14: qty 1

## 2019-04-14 SURGICAL SUPPLY — 46 items
APPLIER CLIP 5 13 M/L LIGAMAX5 (MISCELLANEOUS) ×2
BLADE SURG SZ11 CARB STEEL (BLADE) ×2 IMPLANT
CANISTER SUCT 1200ML W/VALVE (MISCELLANEOUS) ×2 IMPLANT
CHLORAPREP W/TINT 26 (MISCELLANEOUS) ×4 IMPLANT
CLIP APPLIE 5 13 M/L LIGAMAX5 (MISCELLANEOUS) ×1 IMPLANT
COVER WAND RF STERILE (DRAPES) ×2 IMPLANT
DECANTER SPIKE VIAL GLASS SM (MISCELLANEOUS) ×4 IMPLANT
DEFOGGER SCOPE WARMER CLEARIFY (MISCELLANEOUS) ×2 IMPLANT
DERMABOND ADVANCED (GAUZE/BANDAGES/DRESSINGS) ×1
DERMABOND ADVANCED .7 DNX12 (GAUZE/BANDAGES/DRESSINGS) ×1 IMPLANT
ELECT CAUTERY BLADE TIP 2.5 (TIP) ×2
ELECT REM PT RETURN 9FT ADLT (ELECTROSURGICAL) ×2
ELECTRODE CAUTERY BLDE TIP 2.5 (TIP) ×1 IMPLANT
ELECTRODE REM PT RTRN 9FT ADLT (ELECTROSURGICAL) ×1 IMPLANT
GLOVE BIO SURGEON STRL SZ 6.5 (GLOVE) ×2 IMPLANT
GLOVE INDICATOR 7.0 STRL GRN (GLOVE) ×4 IMPLANT
GOWN STRL REUS W/ TWL LRG LVL3 (GOWN DISPOSABLE) ×3 IMPLANT
GOWN STRL REUS W/TWL LRG LVL3 (GOWN DISPOSABLE) ×3
GRASPER SUT TROCAR 14GX15 (MISCELLANEOUS) ×2 IMPLANT
HEMOSTAT SURGICEL 2X3 (HEMOSTASIS) ×4 IMPLANT
IRRIGATION STRYKERFLOW (MISCELLANEOUS) ×1 IMPLANT
IRRIGATOR STRYKERFLOW (MISCELLANEOUS) ×2
IV NS 1000ML (IV SOLUTION) ×1
IV NS 1000ML BAXH (IV SOLUTION) ×1 IMPLANT
KIT TURNOVER KIT A (KITS) ×2 IMPLANT
LABEL OR SOLS (LABEL) ×2 IMPLANT
NEEDLE HYPO 22GX1.5 SAFETY (NEEDLE) ×2 IMPLANT
NS IRRIG 500ML POUR BTL (IV SOLUTION) ×2 IMPLANT
PACK LAP CHOLECYSTECTOMY (MISCELLANEOUS) ×2 IMPLANT
PENCIL ELECTRO HAND CTR (MISCELLANEOUS) ×2 IMPLANT
POUCH SPECIMEN RETRIEVAL 10MM (ENDOMECHANICALS) ×2 IMPLANT
SCISSORS METZENBAUM CVD 33 (INSTRUMENTS) ×2 IMPLANT
SET TUBE SMOKE EVAC HIGH FLOW (TUBING) ×2 IMPLANT
SLEEVE ADV FIXATION 5X100MM (TROCAR) ×4 IMPLANT
SOLUTION ELECTROLUBE (MISCELLANEOUS) ×2 IMPLANT
STRIP CLOSURE SKIN 1/2X4 (GAUZE/BANDAGES/DRESSINGS) ×2 IMPLANT
SUT MNCRL 4-0 (SUTURE) ×2
SUT MNCRL 4-0 27XMFL (SUTURE) ×2
SUT VIC AB 3-0 SH 27 (SUTURE) ×1
SUT VIC AB 3-0 SH 27X BRD (SUTURE) ×1 IMPLANT
SUT VICRYL 0 AB UR-6 (SUTURE) ×4 IMPLANT
SUTURE MNCRL 4-0 27XMF (SUTURE) ×2 IMPLANT
TIP RIGID 35CM EVICEL (HEMOSTASIS) ×2 IMPLANT
TROCAR ADV FIXATION 12X100MM (TROCAR) ×2 IMPLANT
TROCAR Z-THREAD OPTICAL 5X100M (TROCAR) ×2 IMPLANT
WATER STERILE IRR 1000ML POUR (IV SOLUTION) ×2 IMPLANT

## 2019-04-14 NOTE — Discharge Instructions (Signed)
In addition to included general post-operative instructions for laparoscopic cholecystectomy (gallbladder removal),  Diet: Resume home diet.   Activity: No heavy lifting >20 pounds (children, pets, laundry, garbage) for 4 weeks, but light activity and walking are encouraged. Do not drive or drink alcohol if taking narcotic pain medications or having pain that might distract from driving.  Wound care: You may shower/get incision wet with soapy water and pat dry (do not rub incisions), but no baths or submerging incision underwater until follow-up.   Medications: Resume all home medications. For mild to moderate pain: acetaminophen (Tylenol) or ibuprofen/naproxen (if no kidney disease). Combining Tylenol with alcohol can substantially increase your risk of causing liver disease. Narcotic pain medications, if prescribed, can be used for severe pain, though may cause nausea, constipation, and drowsiness. Do not combine Tylenol and Percocet (or similar) within a 6 hour period as Percocet (and similar) contain(s) Tylenol. If you do not need the narcotic pain medication, you do not need to fill the prescription.  Call office [phone] / [phone]) at any time if any questions, worsening pain, fevers/chills, bleeding, drainage from incision site, or other concerns.

## 2019-04-14 NOTE — Anesthesia Postprocedure Evaluation (Signed)
Anesthesia Post Note  Patient: Kendra Santos  Procedure(s) Performed: LAPAROSCOPIC CHOLECYSTECTOMY (N/A Abdomen)  Patient location during evaluation: PACU Anesthesia Type: General Level of consciousness: awake and alert Pain management: pain level controlled Vital Signs Assessment: post-procedure vital signs reviewed and stable Respiratory status: spontaneous breathing, nonlabored ventilation, respiratory function stable and patient connected to nasal cannula oxygen Cardiovascular status: blood pressure returned to baseline and stable Postop Assessment: no apparent nausea or vomiting Anesthetic complications: no     Last Vitals:  Vitals:   04/14/19 1228 04/14/19 1237  BP:  117/71  Pulse: 80 93  Resp: 19   Temp: 36.8 C   SpO2: 98% 97%    Last Pain:  Vitals:   04/14/19 1228  TempSrc:   PainSc: [place] S

## 2019-04-14 NOTE — Progress Notes (Addendum)
[place] Hospital Day(s): 3.   Interval History: Patient seen and examined, no acute events or new complaints overnight. Patient reports she continues to feel improved. No fever, chills, nausea, or emesis. She tolerated CLD yesterday without issue. Plan for laparoscopic cholecystectomy today with Dr Celine Ahr.   Vital signs in last 24 hours: [min-max] current  Temp:  [98.7 F (37.1 C)-100.7 F (38.2 C)] 98.7 F (37.1 C) (09/18 0455) Pulse Rate:  [81-99] 81 (09/18 0455) Resp:  [18-20] 18 (09/18 0455) BP: (102-111)/(67-74) 111/74 (09/18 0455) SpO2:  [98 %-100 %] 99 % (09/18 0455)     Height: 5\' 2"  (157.5 cm) Weight: 84.1 kg BMI (Calculated): 33.9   Intake/Output last 2 shifts:  09/17 0701 - 09/18 0700 In: 2967.5 [I.V.:2834.3; IV Piggyback:133.3] Out: -    Physical Exam:  Constitutional: alert, cooperative and no distress  Respiratory: breathing non-labored at rest  Cardiovascular: regular rate and sinus rhythm  Gastrointestinal: soft, non-tender, and non-distended Integumentary: Warm, dry   Labs:  CBC Latest Ref Rng & Units 04/14/2019 04/13/2019 04/12/2019  WBC 4.0 - 10.5 K/uL 11.5(H) 13.7(H) 13.9(H)  Hemoglobin 12.0 - 15.0 g/dL 12.2 12.0 12.9  Hematocrit 36.0 - 46.0 % 35.9(L) 36.1 37.9  Platelets 150 - 400 K/uL 335 326 359   CMP Latest Ref Rng & Units 04/14/2019 04/13/2019 04/12/2019  Glucose 70 - 99 mg/dL 85 66(L) 127(H)  BUN 6 - 20 mg/dL <5(L) 9 7  Creatinine 0.44 - 1.00 mg/dL 0.44 0.52 0.45  Sodium 135 - 145 mmol/L 135 136 136  Potassium 3.5 - 5.1 mmol/L 3.5 3.7 3.8  Chloride 98 - 111 mmol/L 108 109 105  CO2 22 - 32 mmol/L 21(L) 18(L) 23  Calcium 8.9 - 10.3 mg/dL 8.3(L) 8.2(L) 8.2(L)  Total Protein 6.5 - 8.1 g/dL - 6.6 6.5  Total Bilirubin 0.3 - 1.2 mg/dL - 1.3(H) 2.9(H)  Alkaline Phos 38 - 126 U/L - 175(H) 189(H)  AST 15 - 41 U/L - 72(H) 195(H)  ALT 0 - 44 U/L - 296(H) 447(H)    Imaging studies: No new pertinent imaging  studies   Assessment/Plan: (ICD-10's: K40.10,K81.0) 28 y.o. female with  improving labs and abdominal pain attributable to gallstone pancreatitis and acute cholecystitis and has most likely passed gallstone given clinical improvement   - Clears, ADAT after OR  - IVF ABx  - pain control  - monitor abdominal examination   - Will plan on laparoscopic cholecystectomy with Dr Celine Ahr today, pending OR/Anesthesia availability             - All risks, benefits, and alternatives to above procedure(s) were discussed with the patient, all of her questions were answered to her expressed satisfaction, patient expresses she wishes to proceed, and informed consent was obtained.             - Further management per primary service    All of the above findings and recommendations were discussed with the patient, and the medical team, and all of patient's questions were answered to her expressed satisfaction.  -- Edison Simon, PA-C [facility] 04/14/2019, 7:28 AM [phone] M-F: 7am - 4pm   I saw and evaluated the patient.  I agree with the above documentation, exam, and plan, which I have edited where appropriate. Fredirick Maudlin  9:11 AM

## 2019-04-14 NOTE — Transfer of Care (Signed)
Immediate Anesthesia Transfer of Care Note  Patient: Kendra Santos  Procedure(s) Performed: LAPAROSCOPIC CHOLECYSTECTOMY (N/A Abdomen)  Patient Location: PACU  Anesthesia Type:General  Level of Consciousness: sedated  Airway & Oxygen Therapy: Patient Spontanous Breathing and Patient connected to nasal cannula oxygen  Post-op Assessment: Report given to RN and Post -op Vital signs reviewed and stable  Post vital signs: Reviewed and stable  Last Vitals:  Vitals Value Taken Time  BP 107/68 04/14/19 1139  Temp 36.6 C 04/14/19 1139  Pulse 80 04/14/19 1141  Resp 10 04/14/19 1141  SpO2 100 % 04/14/19 1141  Vitals shown include unvalidated device data.  Last Pain:  Vitals:   04/14/19 1139  TempSrc:   PainSc: Asleep      Patients Stated Pain Goal: 0 (00/76/22 6333)  Complications: No apparent anesthesia complications

## 2019-04-14 NOTE — Progress Notes (Signed)
[place] [facility] [place] NAME: Kendra Santos    MR#:  [id number]  DATE OF BIRTH:  [birth year]  SUBJECTIVE:  CHIEF COMPLAINT:   Chief Complaint  Patient presents with  . Abdominal Pain   No new complaint this morning.  No fevers.  Abdominal pains improved.   Patient scheduled for cholecystectomy by surgery today  REVIEW OF SYSTEMS:  ROS CONSTITUTIONAL: No documented fever. No fatigue, weakness. No weight gain, no weight loss.  EYES: No blurry or double vision.  ENT: No tinnitus. No postnasal drip. No redness of the oropharynx.  RESPIRATORY: No cough, no wheeze, no hemoptysis. No dyspnea.  CARDIOVASCULAR: No chest pain. No orthopnea. No palpitations. No syncope.  GASTROINTESTINAL: No nausea, no vomiting or diarrhea.  Abdominal pain improved GENITOURINARY: No dysuria or hematuria.  ENDOCRINE: No polyuria or nocturia. No heat or cold intolerance.  HEMATOLOGY: No anemia. No bruising. No bleeding.  INTEGUMENTARY: No rashes. No lesions.  MUSCULOSKELETAL: No arthritis. No swelling. No gout.  NEUROLOGIC: No numbness, tingling, or ataxia. No seizure-type activity.  PSYCHIATRIC: No anxiety. No insomnia. No ADD  DRUG ALLERGIES:  No Known Allergies VITALS:  Blood pressure 128/83, pulse 90, temperature 97.6 F (36.4 C), temperature source Oral, resp. rate 16, height 5\' 2"  (1.575 m), weight 84.1 kg, last menstrual period 03/12/2019, SpO2 100 %. PHYSICAL EXAMINATION:  Physical Exam   GENERAL: Pleasant-appearing in no apparent distress.  HEAD, EYES, EARS, NOSE AND THROAT: Atraumatic, normocephalic. Extraocular muscles are intact. Pupils equal and reactive to light. Sclerae anicteric. No conjunctival injection. No oro-pharyngeal erythema.  NECK: Supple. There is no jugular venous distention. No bruits, no lymphadenopathy, no thyromegaly.  HEART: Regular rate and rhythm,. No murmurs, no rubs, no clicks.  LUNGS: Clear to auscultation bilaterally.  No rales or rhonchi. No wheezes.  ABDOMEN: Soft, flat,  mild right upper quadrant tenderness but no rebound or guarding.  No appreciable organomegaly.    EXTREMITIES: No evidence of any cyanosis, clubbing, or peripheral edema.  +2 pedal and radial pulses bilaterally.  NEUROLOGIC: The patient is alert, awake, and oriented x3 with no focal motor or sensory deficits appreciated bilaterally.  SKIN: Moist and warm with no rashes appreciated.  Psych: Not anxious, depressed LN: No inguinal LN enlargement  LABORATORY PANEL:  Female CBC Recent Labs  Lab 04/14/19 0522  WBC 11.5*  HGB 12.2  HCT 35.9*  PLT 335   ------------------------------------------------------------------------------------------------------------------ Chemistries  Recent Labs  Lab 04/13/19 0543 04/14/19 0522  NA 136 135  K 3.7 3.5  CL 109 108  CO2 18* 21*  GLUCOSE 66* 85  BUN 9 <5*  CREATININE 0.52 0.44  CALCIUM 8.2* 8.3*  MG  --  2.0  AST 72*  --   ALT 296*  --   ALKPHOS 175*  --   BILITOT 1.3*  --    RADIOLOGY:  No results found. ASSESSMENT AND PLAN:    28 y.o.femalewithpertinent past medical history ofhypertension and depression presenting to the ED with chief complaints of severe sharp epigastric abdominal pain radiating to the back associated with nausea and vomiting.  1.Acute gallstone pancreatitis Patient reevaluated by gastroenterologist .  MRCP negative for choledocholithiasis or biliary dilatation.  Plan is to postpone ERCP for now to avoid exacerbating pancreatitis. Patient seen by general surgeon.  Plan is for laparoscopic cholecystectomy today Pain control.  IV fluid hydration.  2.Acute cholecystitis- Patient improving clinically with IV fluids  and empiric antibiotics with IV Zosyn.   Patient  reevaluated by gastroenterologist recently who confirmed no evidence of cholangitis despite elevated LFTs.  LFTs trending down. Scheduled for laparoscopic cholecystectomy today  3.HTN   Blood pressure controlled  DVT prophylaxis; SCDs. Plan is to resume Lovenox postop when okay with surgery.  All the records are reviewed and case discussed with Care Management/Social Worker. Management plans discussed with the patient, and she is in agreement.  CODE STATUS: Full Code  TOTAL TIME TAKING CARE OF THIS PATIENT: 33 minutes.   More than 50% of the time was spent in counseling/coordination of care: YES  POSSIBLE D/C IN 2 DAYS, DEPENDING ON CLINICAL CONDITION.   Tamaj Jurgens M.D on 04/14/2019 at 1:16 PM  Between 7am to 6pm - Pager - [phone]  After 6pm go to [email or web address] - Social research officer, [email or web address] [email or web address]  [facility]  Office  [phone]  CC: Primary care physician; Department, [facility]  Note: This dictation was prepared with Dragon dictation along with smaller phrase technology. Any transcriptional errors that result from this process are unintentional.

## 2019-04-14 NOTE — Anesthesia Procedure Notes (Addendum)
Procedure Name: Intubation Date/Time: 04/14/2019 9:42 AM Performed by: Cory Munch, RN Pre-anesthesia Checklist: Patient identified, Emergency Drugs available, Suction available and Patient being monitored Patient Re-evaluated:Patient Re-evaluated prior to induction Oxygen Delivery Method: Circle system utilized Preoxygenation: Pre-oxygenation with 100% oxygen Induction Type: IV induction Ventilation: Mask ventilation without difficulty Laryngoscope Size: Mac and 3 Grade View: Grade III Tube type: Oral Tube size: 7.0 mm Number of attempts: 1 Airway Equipment and Method: Stylet and Oral airway Placement Confirmation: ETT inserted through vocal cords under direct vision,  positive ETCO2 and breath sounds checked- equal and bilateral Secured at: 22 cm Tube secured with: Tape Dental Injury: Teeth and Oropharynx as per pre-operative assessment

## 2019-04-14 NOTE — Anesthesia Post-op Follow-up Note (Signed)
Anesthesia QCDR form completed.        

## 2019-04-14 NOTE — Op Note (Signed)
Laparoscopic Cholecystectomy  Pre-operative Diagnosis: Gallstone pancreatitis  Post-operative Diagnosis: Same  Procedure: Laparoscopic cholecystectomy  Surgeon: Duanne GuessJennifer Naleigha Raimondi, MD  Anesthesia: GETA  Assistant: Laneta SimmersJamie Benson, RNFA   Findings: Slightly swollen and edematous bowel, consistent with recent inflammation from pancreatitis.  No findings suggestive of acute cholecystitis.   Estimated Blood Loss: 30 cc         Drains: None         Specimens: Gallbladder           Complications: none   Procedure Details  The patient was seen again in the preoperative holding area. The benefits, complications, treatment options, and expected outcomes were discussed with the patient. The risks of bleeding, infection, recurrence of symptoms, failure to resolve symptoms, bile duct damage, bile duct leak, retained common bile duct stone, bowel injury, any of which could require further surgery and/or ERCP, stent, or papillotomy were reviewed with the patient. The likelihood of improving the patient's symptoms with return to their baseline status is good.  The patient and/or family concurred with the proposed plan, giving informed consent.  The patient was taken to operating room, identified as Kendra Santos and the procedure verified as Laparoscopic Cholecystectomy. A time out was performed and the above information confirmed.  Prior to the induction of general anesthesia, antibiotic prophylaxis was not administered, the patient was receiving scheduled antibiotics on the floor. VTE prophylaxis was in place. General endotracheal anesthesia was then administered and tolerated well. After the induction, the abdomen was prepped with Chloraprep and draped in the sterile fashion. The patient was positioned in the supine position.  Optiview technique was used to enter the abdominal cavity in the right upper quadrant using a 5 mm trocar. Pneumoperitoneum was then created with CO2 and tolerated well  without any adverse changes in the patient's vital signs.  A periumbilical 12 mm port and 2 additional 5-mm ports were placed in the right upper quadrant all under direct vision. All skin incisions  were infiltrated with a local anesthetic agent before making the incision and placing the trocars.   The patient was positioned  in reverse Trendelenburg, tilted slightly to the patient's left.  The gallbladder was identified, the fundus grasped and retracted cephalad. Adhesions were lysed bluntly. The infundibulum was grasped and retracted laterally, exposing the peritoneum overlying the triangle of Calot. This was then divided and exposed in a blunt fashion. An extended critical view of the cystic duct and cystic artery was obtained.  The cystic duct was clearly identified and bluntly dissected free. Both the cystic artery and duct were double clipped and divided.   The gallbladder was taken from the gallbladder fossa in a retrograde fashion with the electrocautery.  A posterior branch of the cystic artery was identified during this dissection when it began bleeding.  A combination of Surgicel and additional ligaclips were utilized to control the bleeding.  The gallbladder was removed and placed in an Endo pouch bag. The liver bed was irrigated and inspected. Hemostasis was excellent; no further bleeding was seen from the artery.. Copious saline irrigation was utilized and was repeatedly aspirated until clear.  Surgicel and Evicel were applied to the gallbladder fossa overlying where the arterial bleeding had occurred.  The gallbladder and Endo pouch sac were then removed through a port site.   Inspection of the right upper quadrant was performed. No bleeding, bile duct injury or leak, or bowel injury was noted. Pneumoperitoneum was released.  The periumbilical port site was closed with interrumpted  0 Vicryl sutures. 4-0 subcuticular Monocryl was used to close the skin. Dermabond was applied.  The patient was  then extubated and brought to the recovery room in stable condition. Sponge, lap, and needle counts were correct at closure and at the conclusion of the case.               Kendra Maudlin, MD, FACS

## 2019-04-14 NOTE — Plan of Care (Signed)
  Problem: Education: Goal: Knowledge of General Education information will improve Description: Including pain rating scale, medication(s)/side effects and non-pharmacologic comfort measures Outcome: Progressing   Problem: Health Behavior/Discharge Planning: Goal: Ability to manage health-related needs will improve Outcome: Progressing   Problem: Clinical Measurements: Goal: Ability to maintain clinical measurements within normal limits will improve Outcome: Progressing Goal: Will remain free from infection Outcome: Progressing Goal: Diagnostic test results will improve Outcome: Progressing Goal: Cardiovascular complication will be avoided Outcome: Progressing   Problem: Activity: Goal: Risk for activity intolerance will decrease Outcome: Progressing   Problem: Nutrition: Goal: Adequate nutrition will be maintained Outcome: Progressing   Problem: Coping: Goal: Level of anxiety will decrease Outcome: Progressing   Problem: Elimination: Goal: Will not experience complications related to bowel motility Outcome: Progressing Goal: Will not experience complications related to urinary retention Outcome: Progressing   Problem: Pain Managment: Goal: General experience of comfort will improve Outcome: Progressing   Problem: Safety: Goal: Ability to remain free from injury will improve Outcome: Progressing   Problem: Skin Integrity: Goal: Risk for impaired skin integrity will decrease Outcome: Progressing   Problem: Education: Goal: Knowledge of Pancreatitis treatment and prevention will improve Outcome: Progressing   Problem: Nutritional: Goal: Ability to achieve adequate nutritional intake will improve Outcome: Progressing   Problem: Clinical Measurements: Goal: Complications related to the disease process, condition or treatment will be avoided or minimized Outcome: Progressing

## 2019-04-15 LAB — COMPREHENSIVE METABOLIC PANEL
ALT: 177 U/L — ABNORMAL HIGH (ref 0–44)
AST: 53 U/L — ABNORMAL HIGH (ref 15–41)
Albumin: 3.9 g/dL (ref 3.5–5.0)
Alkaline Phosphatase: 136 U/L — ABNORMAL HIGH (ref 38–126)
Anion gap: 11 (ref 5–15)
BUN: <5 mg/dL — ABNORMAL LOW (ref 6–20)
CO2: 21 mmol/L — ABNORMAL LOW (ref 22–32)
Calcium: 8.6 mg/dL — ABNORMAL LOW (ref 8.9–10.3)
Chloride: 106 mmol/L (ref 98–111)
Creatinine, Ser: 0.47 mg/dL (ref 0.44–1.00)
GFR calc Af Amer: >60 mL/min (ref >60)
GFR calc non Af Amer: >60 mL/min (ref >60)
Glucose, Bld: 77 mg/dL (ref 70–99)
Potassium: 3.7 mmol/L (ref 3.5–5.1)
Sodium: 138 mmol/L (ref 135–145)
Total Bilirubin: 0.8 mg/dL (ref 0.3–1.2)
Total Protein: 7.3 g/dL (ref 6.5–8.1)

## 2019-04-15 LAB — CBC
HCT: 38.9 % (ref 36.0–46.0)
Hemoglobin: 13 g/dL (ref 12.0–15.0)
MCH: 30.2 pg (ref 26.0–34.0)
MCHC: 33.4 g/dL (ref 30.0–36.0)
MCV: 90.3 fL (ref 80.0–100.0)
Platelets: 391 10*3/uL (ref 150–400)
RBC: 4.31 MIL/uL (ref 3.87–5.11)
RDW: 12.3 % (ref 11.5–15.5)
WBC: 14.3 10*3/uL — ABNORMAL HIGH (ref 4.0–10.5)
nRBC: 0 % (ref 0.0–0.2)

## 2019-04-15 LAB — MAGNESIUM: Magnesium: 2 mg/dL (ref 1.7–2.4)

## 2019-04-15 MED ORDER — KETOROLAC TROMETHAMINE 15 MG/ML IJ SOLN: 15.0000 mg | Freq: Four times a day (QID) | INTRAMUSCULAR | Status: DC | PRN

## 2019-04-15 NOTE — Progress Notes (Signed)
[facility] Day(s): 4.   Post op day(s): 1 Day Post-Op.   Interval History: Patient seen and examined, no acute events or new complaints overnight. Patient reports feeling nauseous.  She tried to eat breakfast but was unable to complete it due to nausea.  She has not vomited.  Reports the pain is controlled but is exacerbated by the nausea.  Vital signs in last 24 hours: [min-max] current  Temp:  [97.6 F (36.4 C)-98.7 F (37.1 C)] 98.7 F (37.1 C) (09/19 0448) Pulse Rate:  [76-96] 81 (09/19 0448) Resp:  [13-24] 18 (09/19 0448) BP: (107-141)/(68-83) 141/83 (09/19 0448) SpO2:  [95 %-100 %] 100 % (09/19 0448) Weight:  [84.1 kg] 84.1 kg (09/18 0854)     Height: 5\' 2"  (157.5 cm) Weight: 84.1 kg BMI (Calculated): 33.9    Physical Exam:  Constitutional: alert, cooperative and no distress  Respiratory: breathing non-labored at rest  Cardiovascular: regular rate and sinus rhythm  Gastrointestinal: soft, mild-tender, and non-distended.  Wounds are dry and clean  Labs:  CBC Latest Ref Rng & Units 04/15/2019 04/14/2019 04/13/2019  WBC 4.0 - 10.5 K/uL 14.3(H) 11.5(H) 13.7(H)  Hemoglobin 12.0 - 15.0 g/dL 13.0 12.2 12.0  Hematocrit 36.0 - 46.0 % 38.9 35.9(L) 36.1  Platelets 150 - 400 K/uL 391 335 326   CMP Latest Ref Rng & Units 04/15/2019 04/14/2019 04/13/2019  Glucose 70 - 99 mg/dL 77 85 66(L)  BUN 6 - 20 mg/dL <5(L) <5(L) 9  Creatinine 0.44 - 1.00 mg/dL 0.47 0.44 0.52  Sodium 135 - 145 mmol/L 138 135 136  Potassium 3.5 - 5.1 mmol/L 3.7 3.5 3.7  Chloride 98 - 111 mmol/L 106 108 109  CO2 22 - 32 mmol/L 21(L) 21(L) 18(L)  Calcium 8.9 - 10.3 mg/dL 8.6(L) 8.3(L) 8.2(L)  Total Protein 6.5 - 8.1 g/dL 7.3 - 6.6  Total Bilirubin 0.3 - 1.2 mg/dL 0.8 - 1.3(H)  Alkaline Phos 38 - 126 U/L 136(H) - 175(H)  AST 15 - 41 U/L 53(H) - 72(H)  ALT 0 - 44 U/L 177(H) - 296(H)    Imaging studies: No new pertinent imaging studies   Assessment/Plan:  28 y.o. female with  Pancreatitis 1 Day Post-Op s/p laparoscopic cholecystectomy.  Patient today with postoperative nausea and vomiting most likely due to the anesthesia.  She did not tolerated breakfast.  At this moment she is nauseous.  We will continue treatment of nausea as needed.  Once her nausea resolved patient will need to tolerate diet.  May need to stay an extra day to be able to control the nausea.  Pain should be controlled as the nausea is controlled.  Labs shows expected increasing white blood cell count after surgery.  There is decreasing alkaline phosphatase and bilirubin.  There has been no fever, no tachycardia.  Patient encouraged to ambulate.  We will recommend to add DVT prophylaxis.  We will follow while patient is admitted.   Arnold Long, MD

## 2019-04-15 NOTE — Progress Notes (Signed)
[facility] [place] at [facility]   PATIENT NAME: Kendra Santos    MR#:  [id number]  DATE OF BIRTH:  10-17-1990  SUBJECTIVE:  CHIEF COMPLAINT:   Chief Complaint  Patient presents with  . Abdominal Pain   No new complaint this morning.   Abdominal pains improved.    Status post cholecystectomy yesterday Has nausea this morning and unable to tolerate liquid diet No fever No tachycardia Patient said pain medication because her nausea  REVIEW OF SYSTEMS:  ROS CONSTITUTIONAL: No documented fever. No fatigue, weakness. No weight gain, no weight loss.  EYES: No blurry or double vision.  ENT: No tinnitus. No postnasal drip. No redness of the oropharynx.  RESPIRATORY: No cough, no wheeze, no hemoptysis. No dyspnea.  CARDIOVASCULAR: No chest pain. No orthopnea. No palpitations. No syncope.  GASTROINTESTINAL: Has nausea, no vomiting or diarrhea.  Abdominal pain improved GENITOURINARY: No dysuria or hematuria.  ENDOCRINE: No polyuria or nocturia. No heat or cold intolerance.  HEMATOLOGY: No anemia. No bruising. No bleeding.  INTEGUMENTARY: No rashes. No lesions.  MUSCULOSKELETAL: No arthritis. No swelling. No gout.  NEUROLOGIC: No numbness, tingling, or ataxia. No seizure-type activity.  PSYCHIATRIC: No anxiety. No insomnia. No ADD  DRUG ALLERGIES:  No Known Allergies VITALS:  Blood pressure (!) 141/83, pulse 81, temperature 98.7 F (37.1 C), temperature source Oral, resp. rate 18, height 5\' 2"  (1.575 m), weight 84.1 kg, last menstrual period 03/12/2019, SpO2 100 %. PHYSICAL EXAMINATION:  Physical Exam   GENERAL: Pleasant-appearing in no apparent distress.  HEAD, EYES, EARS, NOSE AND THROAT: Atraumatic, normocephalic. Extraocular muscles are intact. Pupils equal and reactive to light. Sclerae anicteric. No conjunctival injection. No oro-pharyngeal erythema.  NECK: Supple. There is no jugular venous distention. No bruits, no lymphadenopathy, no  thyromegaly.  HEART: Regular rate and rhythm,. No murmurs, no rubs, no clicks.  LUNGS: Clear to auscultation bilaterally. No rales or rhonchi. No wheezes.  ABDOMEN: Soft, flat,  mild right upper quadrant tenderness but no rebound or guarding.  No appreciable organomegaly.    EXTREMITIES: No evidence of any cyanosis, clubbing, or peripheral edema.  +2 pedal and radial pulses bilaterally.  NEUROLOGIC: The patient is alert, awake, and oriented x3 with no focal motor or sensory deficits appreciated bilaterally.  SKIN: Moist and warm with no rashes appreciated.  Psych: Not anxious, depressed LN: No inguinal LN enlargement  LABORATORY PANEL:  Female CBC Recent Labs  Lab 04/15/19 0642  WBC 14.3*  HGB 13.0  HCT 38.9  PLT 391   ------------------------------------------------------------------------------------------------------------------ Chemistries  Recent Labs  Lab 04/15/19 0642  NA 138  K 3.7  CL 106  CO2 21*  GLUCOSE 77  BUN <5*  CREATININE 0.47  CALCIUM 8.6*  MG 2.0  AST 53*  ALT 177*  ALKPHOS 136*  BILITOT 0.8   RADIOLOGY:  No results found. ASSESSMENT AND PLAN:    28 y.o.femalewithpertinent past medical history ofhypertension and depression presenting to the ED with chief complaints of severe sharp epigastric abdominal pain radiating to the back associated with nausea and vomiting.  1.Acute gallstone pancreatitis Patient reevaluated by gastroenterologist .  MRCP negative for choledocholithiasis or biliary dilatation.  Plan is to postpone ERCP for now to avoid exacerbating pancreatitis. Patient seen by general surgeon.  Status post laparoscopic cholecystectomy Pain control.  IV fluid hydration. Currently on liquid diet Once nausea improves will advance diet  2.Acute cholecystitis- Patient improving clinically  empiric antibiotics with IV Zosyn stopped.   Liver function tests  improved Status post cholecystectomy  3.  Leukocytosis No evidence of any  tachycardia fever Monitor WBC count  4.HTN  Blood pressure controlled  5.DVT prophylaxis; SCDs.  All the records are reviewed and case discussed with Care Management/Social Worker. Management plans discussed with the patient, and she is in agreement.  CODE STATUS: Full Code  TOTAL TIME TAKING CARE OF THIS PATIENT: 35 minutes.   More than 50% of the time was spent in counseling/coordination of care: YES  POSSIBLE D/C IN 2 DAYS, DEPENDING ON CLINICAL CONDITION.   Saundra Shelling M.D on 04/15/2019 at 11:12 AM  Between 7am to 6pm - Pager - [phone]  After 6pm go to [email or web address] - Proofreader  [facility] [facility]  Office  [phone]  CC: Primary care physician; Department, [facility]  Note: This dictation was prepared with Dragon dictation along with smaller phrase technology. Any transcriptional errors that result from this process are unintentional.

## 2019-04-15 NOTE — Plan of Care (Signed)
Pt reported abdominal tenderness, declined pain meds. Pt vomited once after breakfast. Poor PO intake at lunch. Pt remained on clear liquid diet. IVF infusing.  Ambulation and deep breathing encouraged.

## 2019-04-15 NOTE — Progress Notes (Signed)
Advanced care plan. Purpose of the Encounter: CODE STATUS Parties in Attendance:Patient Patient's Decision Capacity:Good Subjective/Patient's story: 28 y.o. female with pertinent past medical history of hypertension and depressionPresenting to the ED with chief complaints of severe sharp epigastric abdominal pain radiating to the back associated with nausea and vomiting.Patient report onset of symptoms for the past 3 days with worsening symptoms today. Describes episode of severe nausea and vomiting and inability to keep anything down. Patient denied any history of pancreatitis, consuming alcohol, or illegal drug abuse. Denies recent new medication or history of abdominal trauma.  Denies fevers or chills, chest pain, shortness of breath, diarrhea or any other GI related symptoms. On arrival to the ED, she was afebrile, heart rate of 81 bpm, and blood pressure 132/8 mm Hg. She was in severe distress due to pain with severe epigastric tenderness.  Initial labs revealed lipase of 5500, glucose 112, AST 241, ALT 531, alk phos 203, bilirubin 3.0, WBC 14.5.  UA showed presence of ketones otherwise negative for UTI.  Ultrasound of the right upper quadrant showed findings suspicious for acute cholecystitis.  Patient was kept n.p.o., treated with IV fluids, analgesics and antiemetics.  Findings discussed with GI and general  Objective/Medical story Patient needs surgery and GI evaluation Needs cholecystectomy Goals of care determination:  Advance care directives and goals of care discussed Patient wants everything done which includes cpr , intubation and ventilator if need arises CODE STATUS: Full code Time spent discussing advanced care planning: 16 minutes

## 2019-04-16 LAB — CBC
HCT: 34.8 % — ABNORMAL LOW (ref 36.0–46.0)
Hemoglobin: 11.9 g/dL — ABNORMAL LOW (ref 12.0–15.0)
MCH: 30.2 pg (ref 26.0–34.0)
MCHC: 34.2 g/dL (ref 30.0–36.0)
MCV: 88.3 fL (ref 80.0–100.0)
Platelets: 374 10*3/uL (ref 150–400)
RBC: 3.94 MIL/uL (ref 3.87–5.11)
RDW: 12.1 % (ref 11.5–15.5)
WBC: 11.9 10*3/uL — ABNORMAL HIGH (ref 4.0–10.5)
nRBC: 0 % (ref 0.0–0.2)

## 2019-04-16 LAB — BASIC METABOLIC PANEL
Anion gap: 9 (ref 5–15)
BUN: <5 mg/dL — ABNORMAL LOW (ref 6–20)
CO2: 23 mmol/L (ref 22–32)
Calcium: 8.5 mg/dL — ABNORMAL LOW (ref 8.9–10.3)
Chloride: 106 mmol/L (ref 98–111)
Creatinine, Ser: 0.41 mg/dL — ABNORMAL LOW (ref 0.44–1.00)
GFR calc Af Amer: >60 mL/min (ref >60)
GFR calc non Af Amer: >60 mL/min (ref >60)
Glucose, Bld: 93 mg/dL (ref 70–99)
Potassium: 3.6 mmol/L (ref 3.5–5.1)
Sodium: 138 mmol/L (ref 135–145)

## 2019-04-16 MED ORDER — ONDANSETRON HCL 4 MG PO TABS: 4.0000 mg | ORAL_TABLET | Freq: Four times a day (QID) | ORAL | 0 refills | Status: DC | PRN

## 2019-04-16 MED ORDER — OXYCODONE-ACETAMINOPHEN 5-325 MG PO TABS: 1.0000 | ORAL_TABLET | Freq: Four times a day (QID) | ORAL | 0 refills | Status: AC | PRN

## 2019-04-16 NOTE — Progress Notes (Signed)
Discharge instructions reviewed with patient. Verbalized understanding. IVs removed. Stable condition. She is now getting dressed waiting for her mother to arrive.

## 2019-04-16 NOTE — Progress Notes (Signed)
[facility] Hospital Day(s): 5.   Post op day(s): 2 Days Post-Op.   Interval History: Patient seen and examined, no acute events or new complaints overnight. Patient reports feeling better today.  To report that she passed gas and that made her feel better.  Denies nausea or vomiting.  Denies fever or chills.  Vital signs in last 24 hours: [min-max] current  Temp:  [98.6 F (37 C)-99.3 F (37.4 C)] 98.6 F (37 C) (09/20 0533) Pulse Rate:  [71-82] 71 (09/20 0533) Resp:  [16] 16 (09/20 0533) BP: (116-124)/(77-81) 116/77 (09/20 0533) SpO2:  [98 %-99 %] 99 % (09/20 0533)     Height: 5\' 2"  (157.5 cm) Weight: 84.1 kg BMI (Calculated): 33.9   IPhysical Exam:  Constitutional: alert, cooperative and no distress  Respiratory: breathing non-labored at rest  Cardiovascular: regular rate and sinus rhythm  Gastrointestinal: soft, non-tender, and non-distended. Wounds are dry and clean.   Labs:  CBC Latest Ref Rng & Units 04/16/2019 04/15/2019 04/14/2019  WBC 4.0 - 10.5 K/uL 11.9(H) 14.3(H) 11.5(H)  Hemoglobin 12.0 - 15.0 g/dL 11.9(L) 13.0 12.2  Hematocrit 36.0 - 46.0 % 34.8(L) 38.9 35.9(L)  Platelets 150 - 400 K/uL 374 391 335   CMP Latest Ref Rng & Units 04/16/2019 04/15/2019 04/14/2019  Glucose 70 - 99 mg/dL 93 77 85  BUN 6 - 20 mg/dL <5(L) <5(L) <5(L)  Creatinine 0.44 - 1.00 mg/dL 0.41(L) 0.47 0.44  Sodium 135 - 145 mmol/L 138 138 135  Potassium 3.5 - 5.1 mmol/L 3.6 3.7 3.5  Chloride 98 - 111 mmol/L 106 106 108  CO2 22 - 32 mmol/L 23 21(L) 21(L)  Calcium 8.9 - 10.3 mg/dL 8.5(L) 8.6(L) 8.3(L)  Total Protein 6.5 - 8.1 g/dL - 7.3 -  Total Bilirubin 0.3 - 1.2 mg/dL - 0.8 -  Alkaline Phos 38 - 126 U/L - 136(H) -  AST 15 - 41 U/L - 53(H) -  ALT 0 - 44 U/L - 177(H) -    Imaging studies: No new pertinent imaging studies   Assessment/Plan:  28 y.o. female with Pancreatitis 2 Day Post-Op s/p laparoscopic cholecystectomy. Patient reports feeling much better today.  She did  tolerate clear liquids last night.  I advance her diet to full liquids.  If she tolerated full liquid she may be discharged after she eats.  The pain is controlled.  There has been no nausea or vomiting.  There has been no fever.  White blood cell count increasing.  Patient ambulating.   Arnold Long, MD

## 2019-04-16 NOTE — Plan of Care (Signed)
  Problem: Education: Goal: Knowledge of General Education information will improve Description: Including pain rating scale, medication(s)/side effects and non-pharmacologic comfort measures Outcome: Progressing   Problem: Health Behavior/Discharge Planning: Goal: Ability to manage health-related needs will improve Outcome: Progressing   Problem: Clinical Measurements: Goal: Ability to maintain clinical measurements within normal limits will improve Outcome: Progressing Goal: Will remain free from infection Outcome: Progressing Goal: Diagnostic test results will improve Outcome: Progressing Goal: Cardiovascular complication will be avoided Outcome: Progressing   Problem: Activity: Goal: Risk for activity intolerance will decrease Outcome: Progressing   Problem: Nutrition: Goal: Adequate nutrition will be maintained Outcome: Progressing   Problem: Coping: Goal: Level of anxiety will decrease Outcome: Progressing   Problem: Elimination: Goal: Will not experience complications related to bowel motility Outcome: Progressing Goal: Will not experience complications related to urinary retention Outcome: Progressing   Problem: Pain Managment: Goal: General experience of comfort will improve Outcome: Progressing   Problem: Safety: Goal: Ability to remain free from injury will improve Outcome: Progressing   Problem: Skin Integrity: Goal: Risk for impaired skin integrity will decrease Outcome: Progressing   Problem: Education: Goal: Knowledge of Pancreatitis treatment and prevention will improve Outcome: Progressing   Problem: Nutritional: Goal: Ability to achieve adequate nutritional intake will improve Outcome: Progressing   Problem: Clinical Measurements: Goal: Complications related to the disease process, condition or treatment will be avoided or minimized Outcome: Progressing   

## 2019-04-16 NOTE — Discharge Summary (Signed)
[place] at [facility]   PATIENT NAME: Kendra Santos    MR#:  [id number]  DATE OF BIRTH:  01/23/1991  DATE OF ADMISSION:  04/11/2019 ADMITTING PHYSICIAN: Lang Snow, NP  DATE OF DISCHARGE: 04/16/2019  PRIMARY CARE PHYSICIAN: Department, [facility]   ADMISSION DIAGNOSIS:  Acute cholecystitis [K81.0] Epigastric pain [R10.13] Elevated LFTs [R94.5] Acute pancreatitis, unspecified complication status, unspecified pancreatitis type [K85.90]  DISCHARGE DIAGNOSIS:  Active Problems:   Acute pancreatitis Acute gallstone pancreatitis Acute cholecystitis Leukocytosis Hypertension  SECONDARY DIAGNOSIS:   Past Medical History:  Diagnosis Date  . Breast abscess   . Hypertension      ADMITTING HISTORY 28 y.o. female with pertinent past medical history of hypertension and depressionPresenting to the ED with chief complaints of severe sharp epigastric abdominal pain radiating to the back associated with nausea and vomiting. Patient report onset of symptoms for the past 3 days with worsening symptoms today. Describes episode of severe nausea and vomiting and inability to keep anything down. Patient denied any history of pancreatitis, consuming alcohol, or illegal drug abuse. Denies recent new medication or history of abdominal trauma.  Denies fevers or chills, chest pain, shortness of breath, diarrhea or any other GI related symptoms. On arrival to the ED, she was afebrile, heart rate of 81 bpm, and blood pressure 132/8 mm Hg. She was in severe distress due to pain with severe epigastric tenderness.  Initial labs revealed lipase of 5500, glucose 112, AST 241, ALT 531, alk phos 203, bilirubin 3.0, WBC 14.5.  UA showed presence of ketones otherwise negative for UTI.  Ultrasound of the right upper quadrant showed findings suspicious for acute cholecystitis.  Patient was kept n.p.o., treated with IV fluids, analgesics and antiemetics.   Findings discussed with GI and general surgery who will evaluate patient in a.m.   HOSPITAL COURSE:  And was admitted to medical floor for gallstone pancreatitis with elevated lipase level and abnormal LFTs.  Patient was worked up with right upper quadrant ultrasound and CT abdomen she.  She was initially kept n.p.o. and IV fluids were given.  Gastroenterology and surgery consultations were done.  Patient was also given IV Zosyn antibiotic for cholecystitis.  WBC count was monitored.  Patient was also worked up with MRI abdomen in the hospital.  COVID-19 test was negative.  Patient tolerated IV antibiotics well her lipase level decreased to normal and liver function tests also decreased.  She underwent laparoscopic cholecystectomy and tolerated procedure well by surgical service.  Patient tolerated diet well.  Patient will be discharged home and follow-up with surgery in the clinic  CONSULTS OBTAINED:  Treatment Team:  Fredirick Maudlin, MD  DRUG ALLERGIES:  No Known Allergies  DISCHARGE MEDICATIONS:   Allergies as of 04/16/2019   No Known Allergies     Medication List    TAKE these medications   ondansetron 4 MG tablet Commonly known as: ZOFRAN Take 1 tablet (4 mg total) by mouth every 6 (six) hours as needed for nausea.   oxyCODONE-acetaminophen 5-325 MG tablet Commonly known as: Percocet Take 1 tablet by mouth every 6 (six) hours as needed for up to 5 days for moderate pain or severe pain.       Today  Patient seen today Tolerated diet well Hemodynamically stable VITAL SIGNS:  Blood pressure 119/78, pulse 80, temperature 98.6 F (37 C), temperature source Oral, resp. rate 18, height '5\' 2"'  (1.575 m), weight 84.1 kg, last menstrual period 03/12/2019, SpO2 98 %.  I/O:    Intake/Output Summary (Last 24 hours) at 04/16/2019 1036 Last data filed at 04/16/2019 1004 Gross per 24 hour  Intake 3861.37 ml  Output 1100 ml  Net 2761.37 ml    PHYSICAL EXAMINATION:  Physical  Exam  GENERAL:  28 y.o.-year-old patient lying in the bed with no acute distress.  LUNGS: Normal breath sounds bilaterally, no wheezing, rales,rhonchi or crepitation. No use of accessory muscles of respiration.  CARDIOVASCULAR: S1, S2 normal. No murmurs, rubs, or gallops.  ABDOMEN: Soft, non-tender, non-distended. Bowel sounds present. No organomegaly or mass.  NEUROLOGIC: Moves all 4 extremities. PSYCHIATRIC: The patient is alert and oriented x 3.  SKIN: No obvious rash, lesion, or ulcer.   DATA REVIEW:   CBC Recent Labs  Lab 04/16/19 0428  WBC 11.9*  HGB 11.9*  HCT 34.8*  PLT 374    Chemistries  Recent Labs  Lab 04/15/19 0642 04/16/19 0428  NA 138 138  K 3.7 3.6  CL 106 106  CO2 21* 23  GLUCOSE 77 93  BUN <5* <5*  CREATININE 0.47 0.41*  CALCIUM 8.6* 8.5*  MG 2.0  --   AST 53*  --   ALT 177*  --   ALKPHOS 136*  --   BILITOT 0.8  --     Cardiac Enzymes No results for input(s): TROPONINI in the last 168 hours.  Microbiology Results  Results for orders placed or performed during the hospital encounter of 04/11/19  SARS CORONAVIRUS 2 (TAT 6-24 HRS) Nasopharyngeal Nasopharyngeal Swab     Status: None   Collection Time: 04/11/19  8:57 PM   Specimen: Nasopharyngeal Swab  Result Value Ref Range Status   SARS Coronavirus 2 NEGATIVE NEGATIVE Final    Comment: (NOTE) SARS-CoV-2 target nucleic acids are NOT DETECTED. The SARS-CoV-2 RNA is generally detectable in upper and lower respiratory specimens during the acute phase of infection. Negative results do not preclude SARS-CoV-2 infection, do not rule out co-infections with other pathogens, and should not be used as the sole basis for treatment or other patient management decisions. Negative results must be combined with clinical observations, patient history, and epidemiological information. The expected result is Negative. Fact Sheet for Patients: SugarRoll.be Fact Sheet for  Healthcare Providers: https://www.woods-mathews.com/ This test is not yet approved or cleared by the [place] FDA and  has been authorized for detection and/or diagnosis of SARS-CoV-2 by FDA under an Emergency Use Authorization (EUA). This EUA will remain  in effect (meaning this test can be used) for the duration of the COVID-19 declaration under Section 56 4(b)(1) of the Act, 21 U.S.C. section 360bbb-3(b)(1), unless the authorization is terminated or revoked sooner. Performed at [facility], [place] [address] [place], [place] [address]     RADIOLOGY:  No results found.  Follow up with PCP in 1 week.  Management plans discussed with the patient, family and they are in agreement.  CODE STATUS: Full code    Code Status Orders  (From admission, onward)         Start     Ordered   04/11/19 2224  Full code  Continuous     04/11/19 2227        Code Status History    Date Active Date Inactive Code Status Order ID Comments User Context   08/08/2018 0930 08/08/2018 1508 Full Code [id number]  Rexene Agent, CNM Inpatient   Advance Care Planning Activity      TOTAL TIME TAKING CARE OF THIS PATIENT  ON DAY OF DISCHARGE: more than 35 minutes.   Saundra Shelling M.D on 04/16/2019 at 10:36 AM  Between 7am to 6pm - Pager - [phone]  After 6pm go to [email or web address] - password [email or web address] [facility]  Office  [phone]  CC: Primary care physician; Department, [facility]  Note: This dictation was prepared with Dragon dictation along with smaller phrase technology. Any transcriptional errors that result from this process are unintentional.

## 2019-04-17 LAB — SURGICAL PATHOLOGY

## 2020-11-14 IMAGING — US US ABDOMEN LIMITED
1 series · 14 of 25 positions shown · non-contrast
Comparison: October 09, 2018

CLINICAL DATA: Epigastric pain for 3 days

EXAM:
ULTRASOUND ABDOMEN LIMITED RIGHT UPPER QUADRANT

[Series 1: us abdomen limited · 14 of 39 slices shown]
[im 1/39]
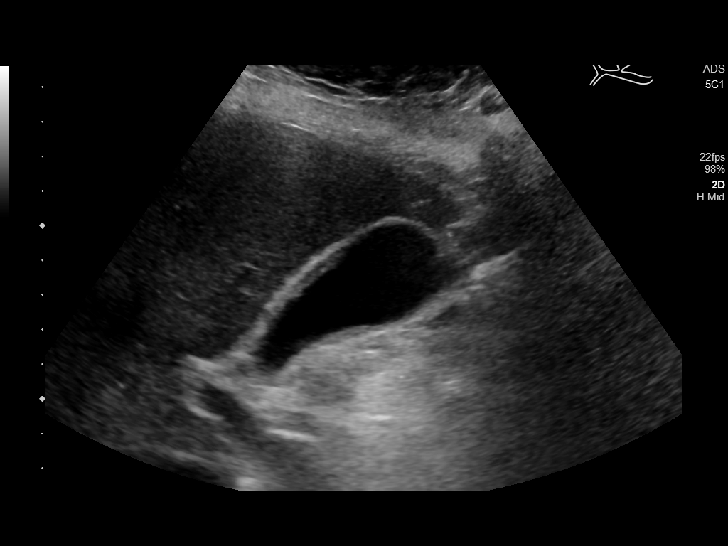
[im 4/39]
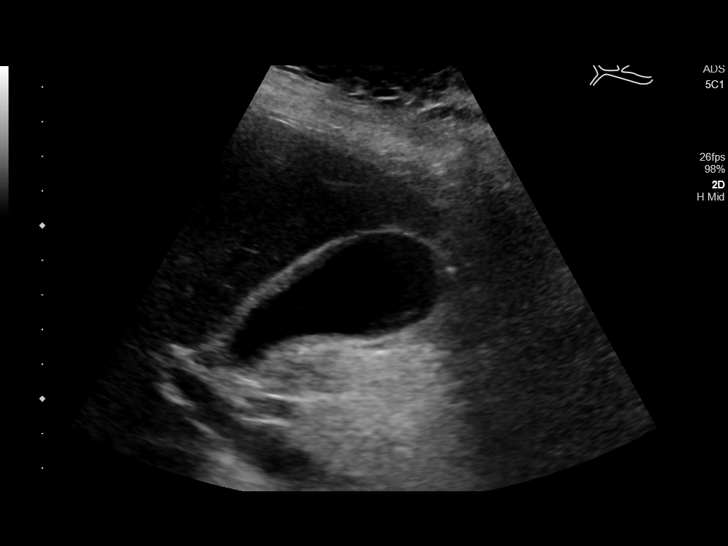
[im 7/39]
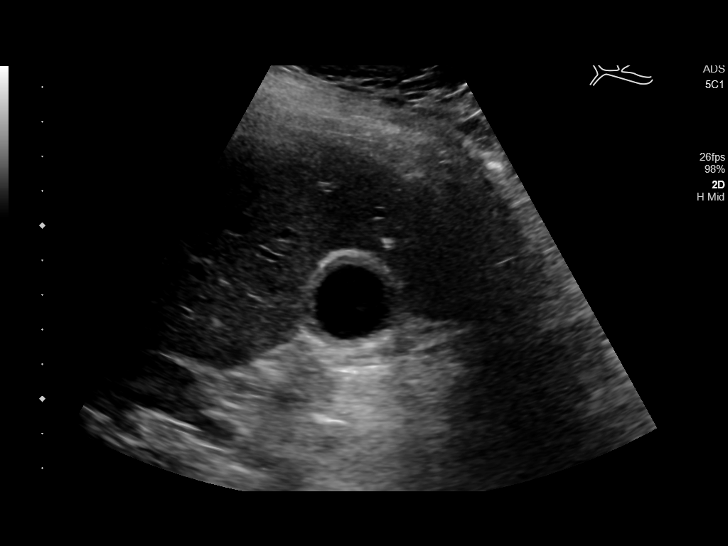
[im 10/39]
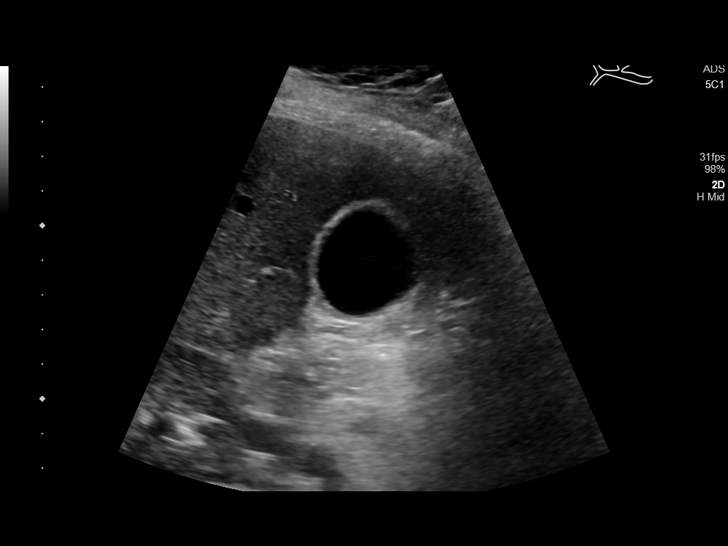
[im 13/39]
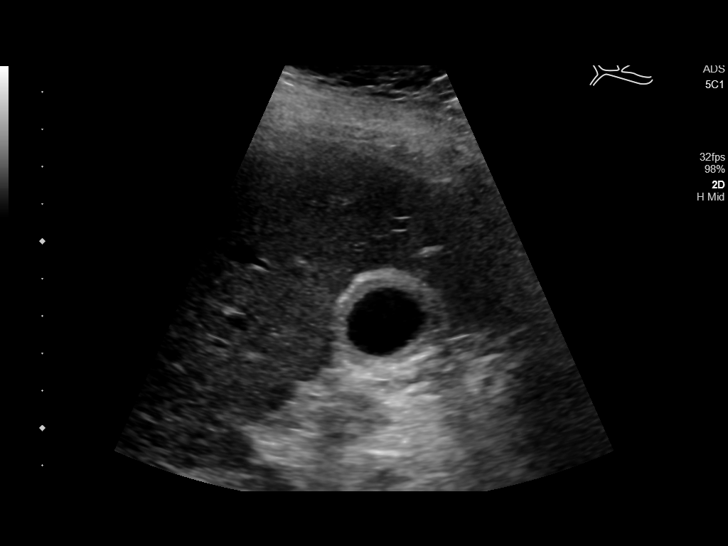
[im 15/39]
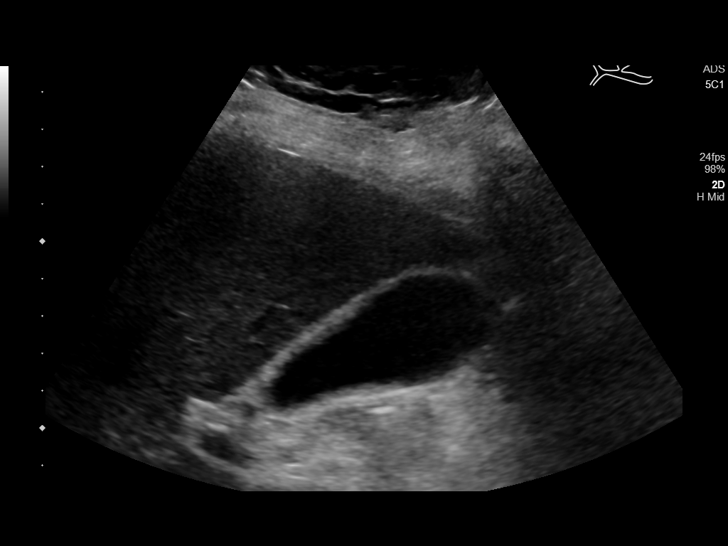
[im 18/39]
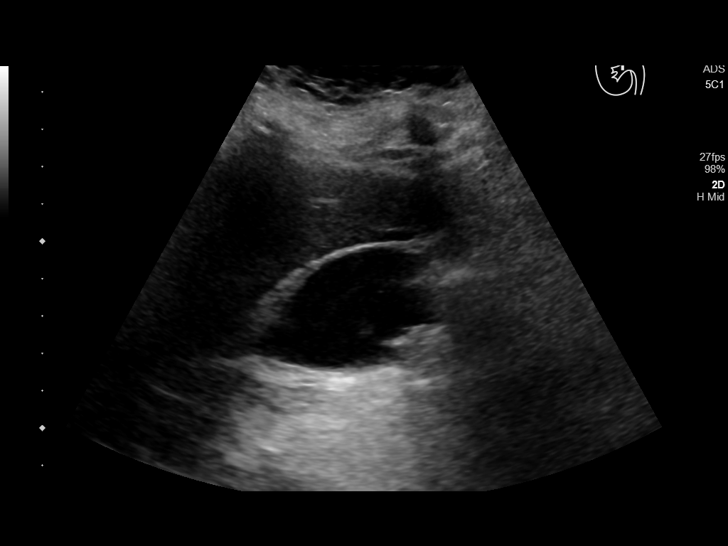
[im 21/39]
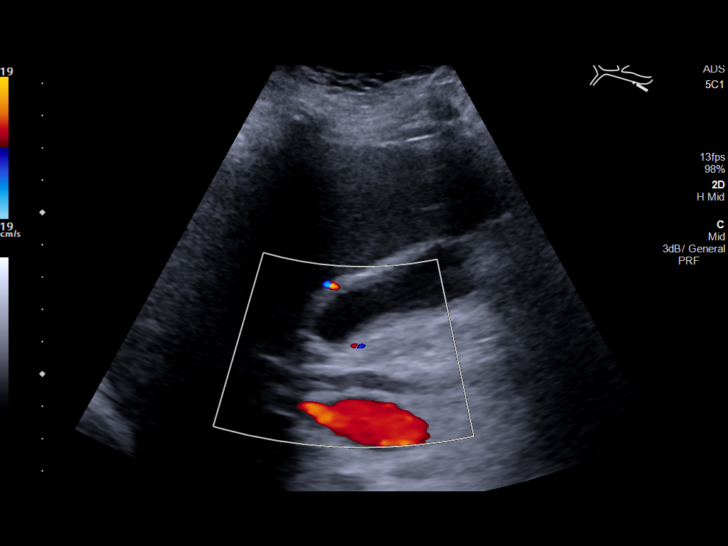
[im 24/39]
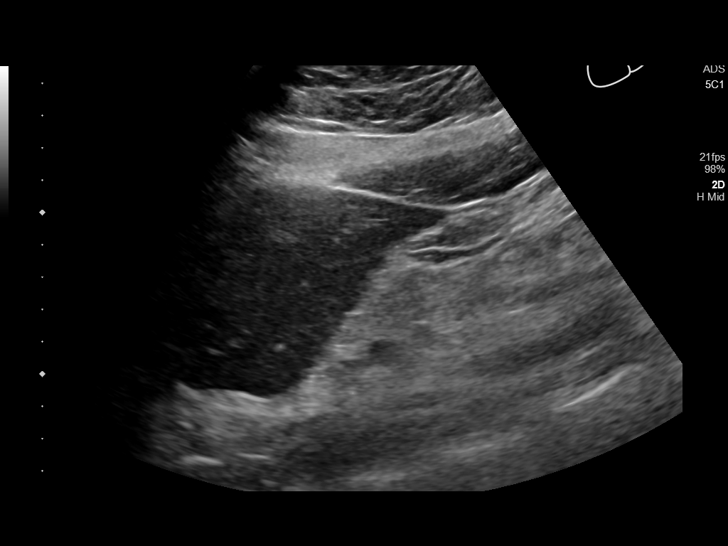
[im 26/39]
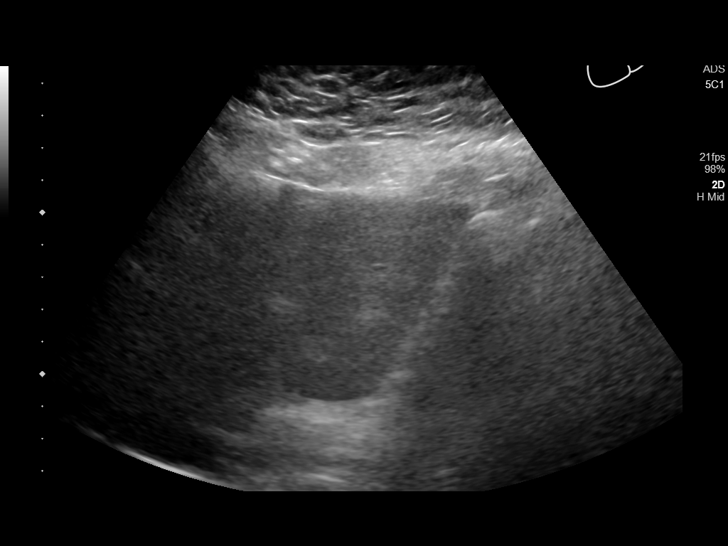
[im 29/39]
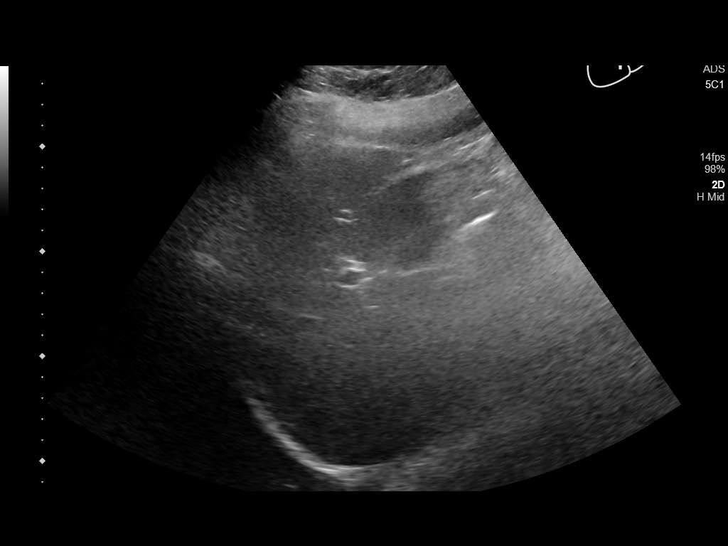
[im 32/39]
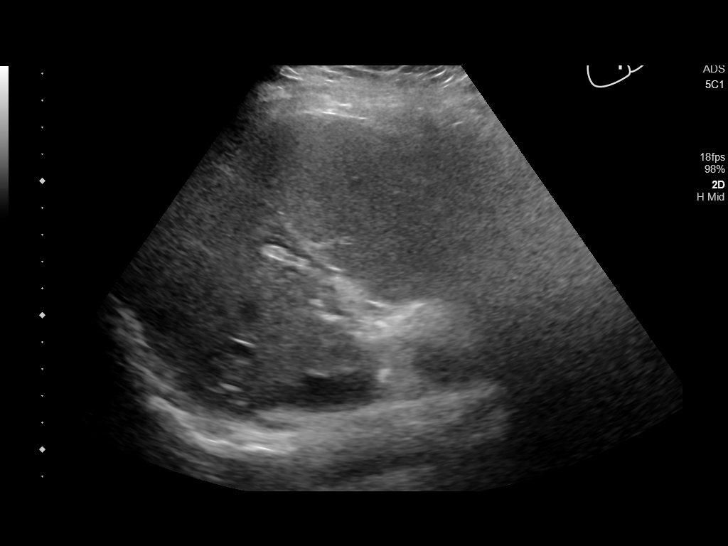
[im 35/39]
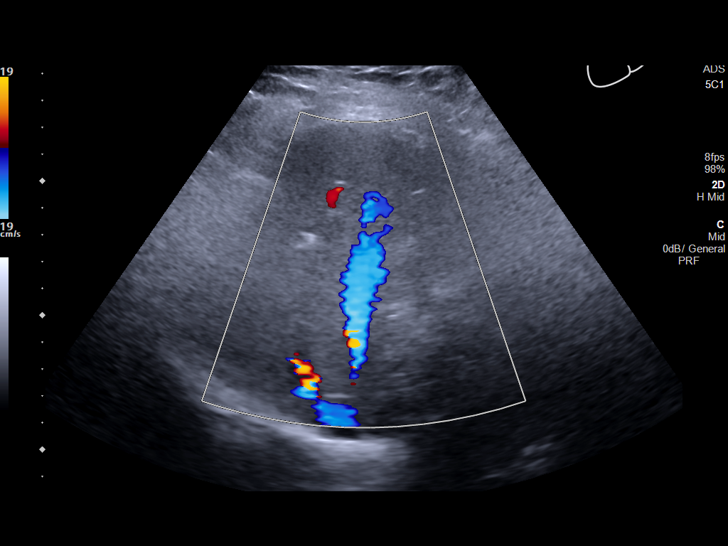
[im 39/39]
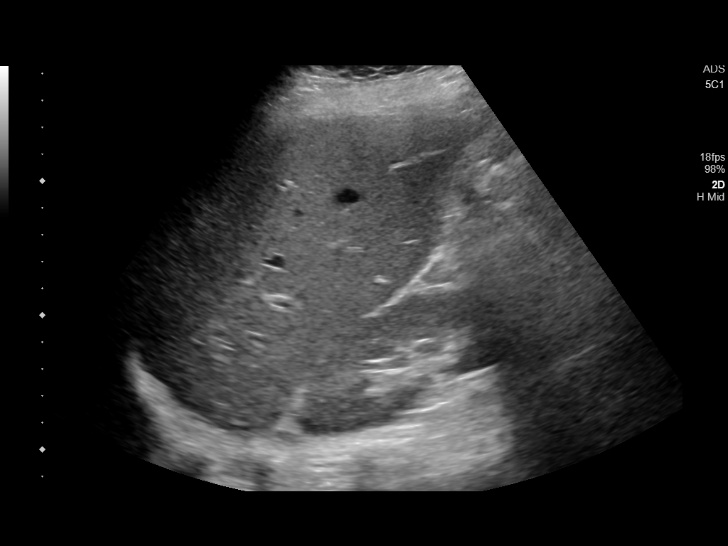

[14 of 25 positions shown; findings below may reference images not displayed]

FINDINGS: Gallbladder:

Gallstones are identified. The gallbladder wall measures 5 mm. The
ultrasound technologist reports positive sonographic Murphy sign.

Common bile duct:

Diameter: 4.1 mm

Liver:

No focal lesion identified. Within normal limits in parenchymal
echogenicity. Portal vein is patent on color Doppler imaging with
normal direction of blood flow towards the liver.

Other: None.
IMPRESSION: Findings suspicious for acute cholecystitis.

## 2020-11-15 IMAGING — MR MR ABDOMEN WO/W CM MRCP
19 of 22 series · 43 of 48 positions shown · IV contrast (8ml Gadavist)
Comparison: Abdominal ultrasound of 04/11/2019

CLINICAL DATA: Abdominal pain, elevated lipase, epigastric pain.
Nausea and vomiting.

EXAM:
MRI ABDOMEN WITHOUT AND WITH CONTRAST (INCLUDING MRCP)
TECHNIQUE: Multiplanar multisequence MR imaging of the abdomen was performed
both before and after the administration of intravenous contrast.
Heavily T2-weighted images of the biliary and pancreatic ducts were
obtained, and three-dimensional MRCP images were rendered by post
processing.
CONTRAST:  8mL GADAVIST GADOBUTROL 1 MMOL/ML IV SOLN

[Series 3: bSSFP · coronal · 6.0mm · 0.74mm/px · 1 of 30 slices shown]
[im 1/30]
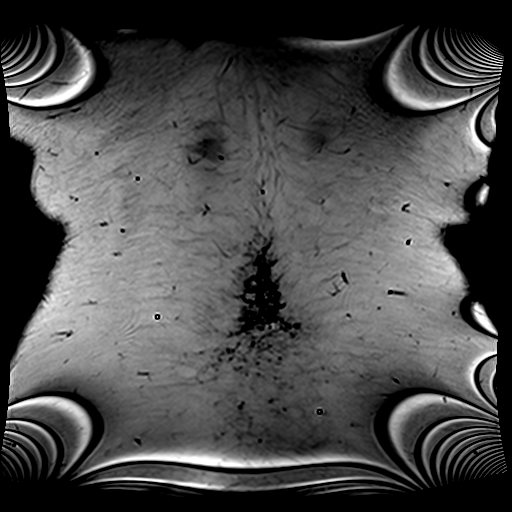

[Series 4: T2 · axial · 6.0mm · 1.19mm/px · 1 of 32 slices shown]
[im 1/32]
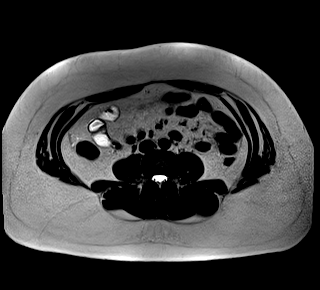

[Series 5: T1 · axial · 6.0mm · 0.74mm/px · z∈[-47,+177]mm · 2 of 64 slices shown]
[im 1/64]
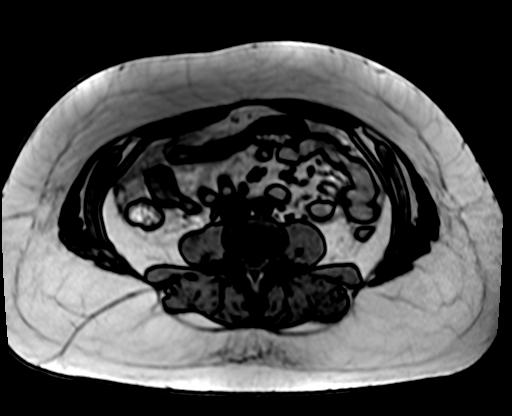
[im 64/64]
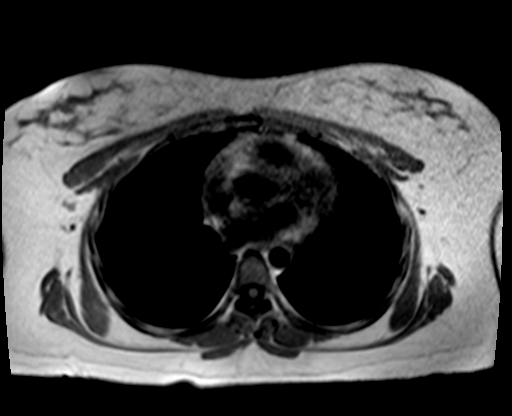

[Series 7: T2 fat-sat · axial · 6.0mm · 1.19mm/px · 1 of 30 slices shown]
[im 1/30]
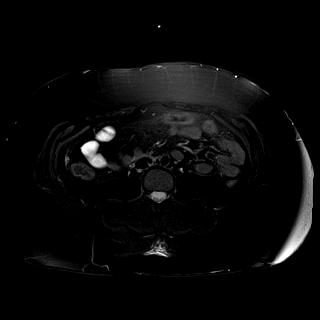

[Series 9: ax dwi_tracew · axial · 6.0mm · 1.42mm/px · z∈[-29,+180]mm · 4 of 90 slices shown]
[im 1/90]
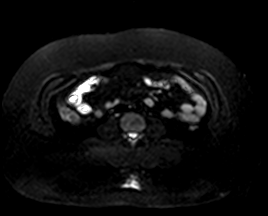
[im 30/90]
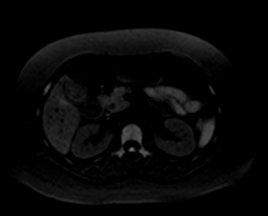
[im 60/90]
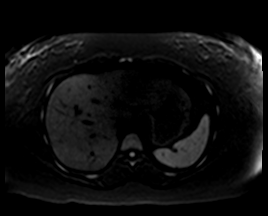
[im 90/90]
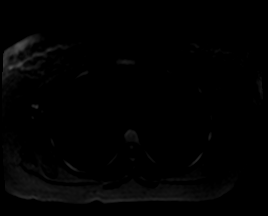

[Series 10: ax dwi_adc · axial · 6.0mm · 1.42mm/px · 1 of 30 slices shown]
[im 1/30]
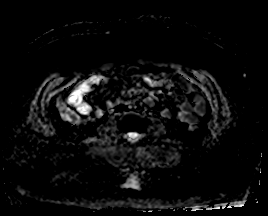

[Series 14: MRCP · coronal · 3.0mm · 1.12mm/px · 1 of 20 slices shown]
[im 1/20]
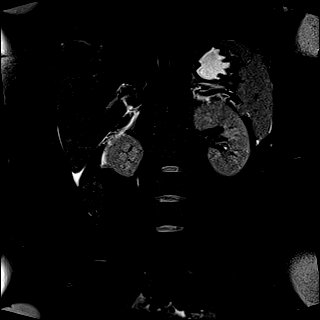

[Series 15: radials · coronal · 50.0mm · 0.78mm/px · 1 of 5 slices shown]
[im 1/5]
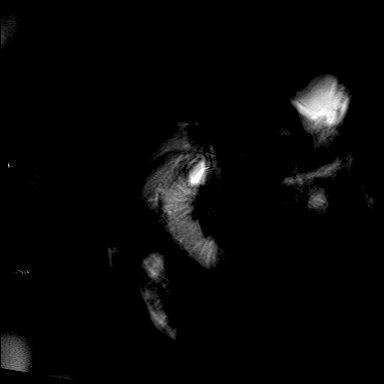

[Series 16: T1 dynamic fat-sat · axial · non-contrast · 3.0mm · 1.19mm/px · z∈[-32,+181]mm · 3 of 72 slices shown (1 of 5)]
[im 1/72]
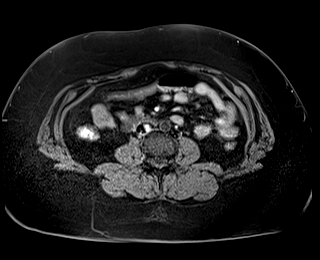
[im 36/72]
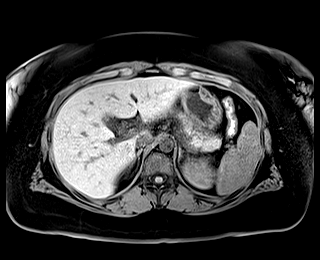
[im 72/72]
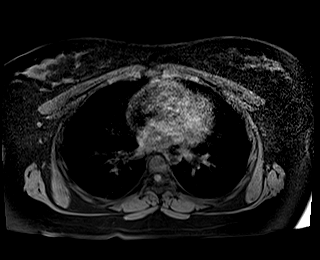

[Series 17: T1 dynamic fat-sat post-contrast · axial · 3.0mm · 1.19mm/px · z∈[-32,+181]mm · 3 of 72 slices shown (1 of 4)]
[im 1/72]
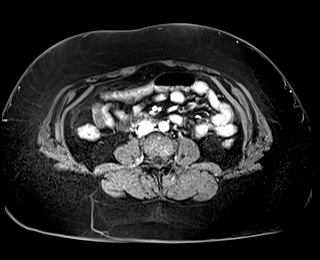
[im 36/72]
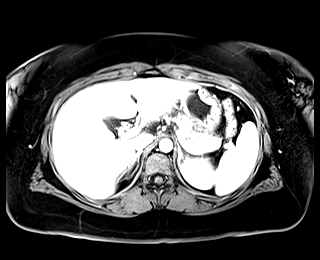
[im 72/72]
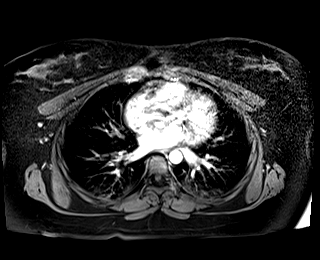

[Series 18: T1 dynamic fat-sat · axial · 3.0mm · 1.19mm/px · z∈[-32,+181]mm · 3 of 72 slices shown (2 of 5)]
[im 1/72]
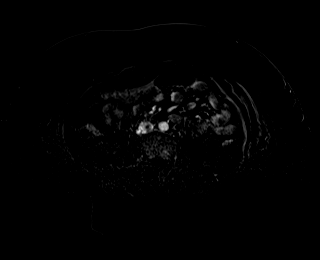
[im 36/72]
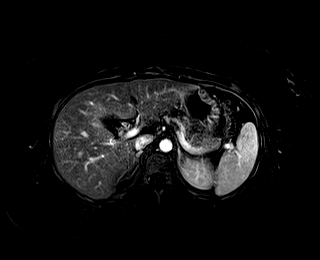
[im 72/72]
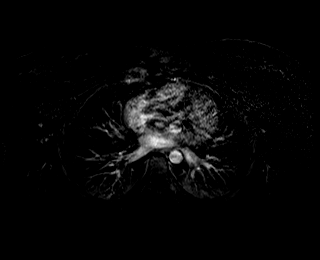

[Series 19: T1 dynamic fat-sat post-contrast · axial · 3.0mm · 1.19mm/px · z∈[-32,+181]mm · 3 of 72 slices shown (2 of 4)]
[im 1/72]
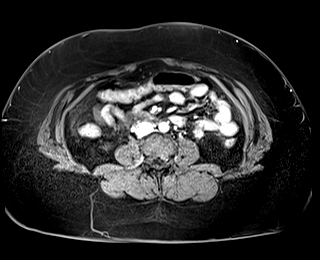
[im 36/72]
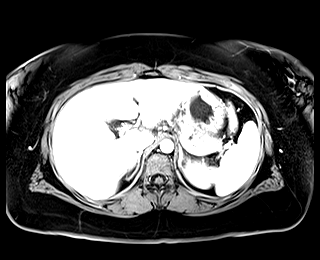
[im 72/72]
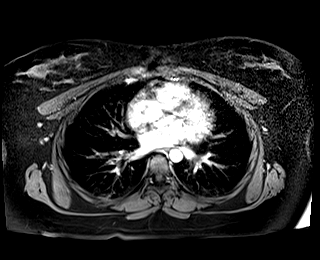

[Series 20: T1 dynamic fat-sat · axial · 3.0mm · 1.19mm/px · z∈[-32,+181]mm · 3 of 72 slices shown (3 of 5)]
[im 1/72]
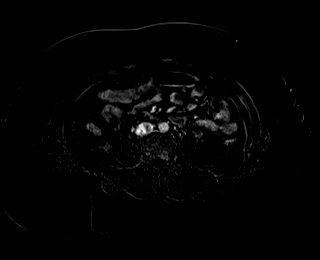
[im 36/72]
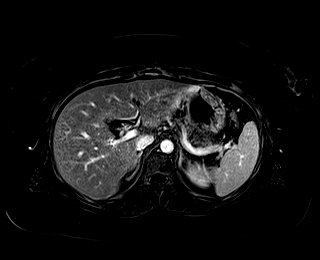
[im 72/72]
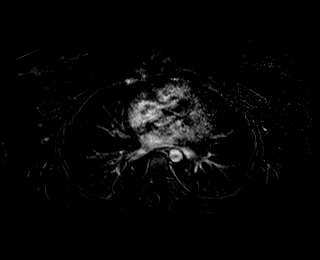

[Series 21: T1 dynamic fat-sat post-contrast · axial · 3.0mm · 1.19mm/px · z∈[-32,+181]mm · 3 of 72 slices shown (3 of 4)]
[im 1/72]
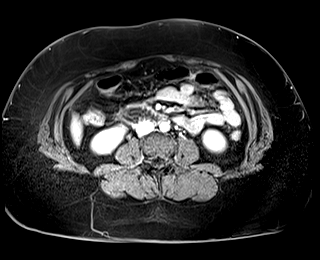
[im 36/72]
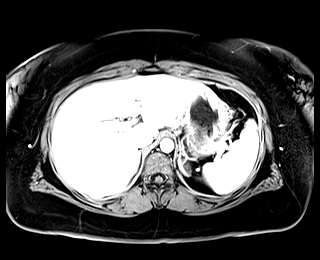
[im 72/72]
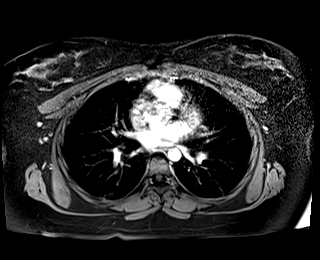

[Series 22: T1 dynamic fat-sat · axial · 3.0mm · 1.19mm/px · z∈[-32,+181]mm · 3 of 72 slices shown (4 of 5)]
[im 1/72]
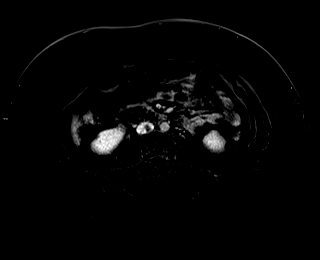
[im 36/72]
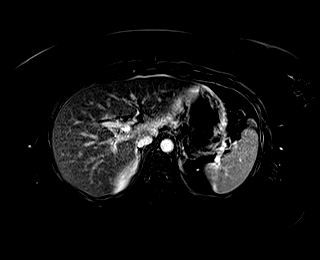
[im 72/72]
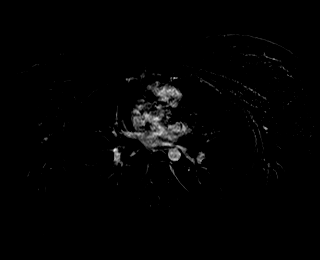

[Series 23: T1 dynamic post-contrast · coronal · 3.0mm · 1.31mm/px · 3 of 72 slices shown]
[im 1/72]
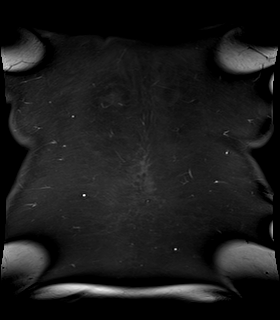
[im 36/72]
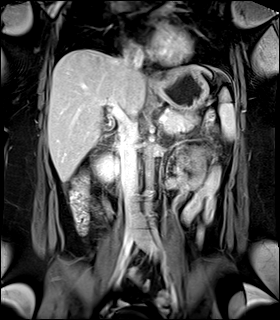
[im 72/72]
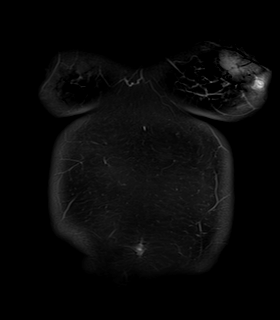

[Series 24: T1 dynamic fat-sat post-contrast · axial · 3.0mm · 1.19mm/px · z∈[-32,+181]mm · 3 of 72 slices shown (4 of 4)]
[im 1/72]
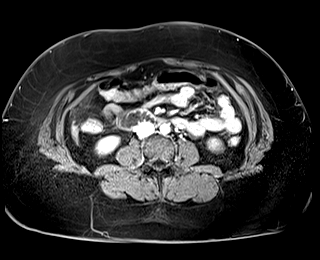
[im 36/72]
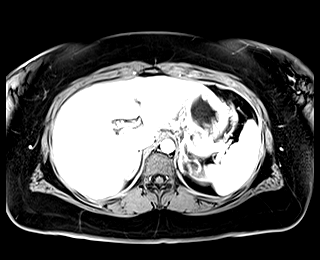
[im 72/72]
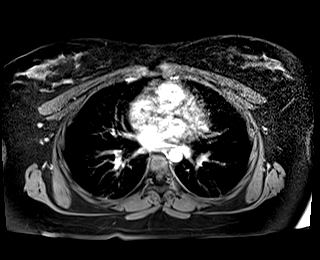

[Series 25: T1 dynamic fat-sat · axial · 3.0mm · 1.19mm/px · z∈[-32,+181]mm · 3 of 72 slices shown (5 of 5)]
[im 1/72]
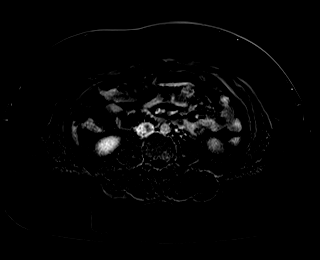
[im 36/72]
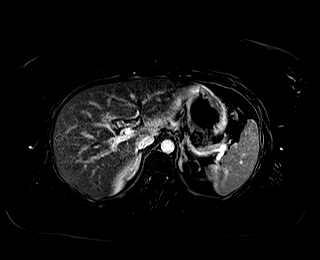
[im 72/72]
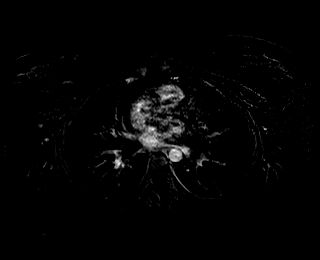

[Series 1029: h-f · axial · 0.6mm · 0.43mm/px · 1 of 18 slices shown]
[im 1/18]
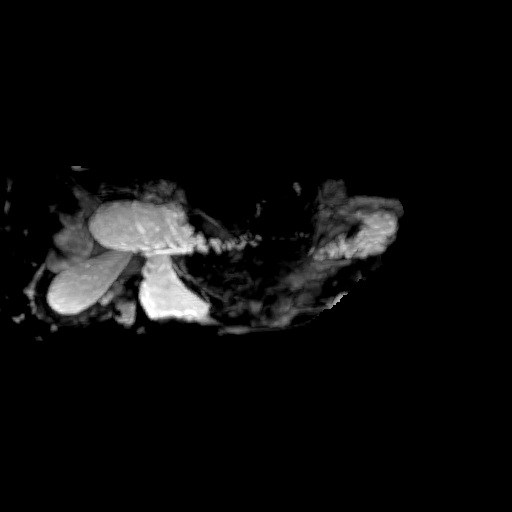

[43 of 48 positions shown; findings below may reference images not displayed]

FINDINGS: Lower chest: Unremarkable

Hepatobiliary: Gallbladder wall thickening up to 0.7 cm in single
wall thickness. Small gallstones noted in the gallbladder. Trace
pericholecystic fluid. Mild periportal edema.

Common bile duct measures up to 0.5 cm. No filling defect
identified. No CBD irregularity. Expected distal conical tapering.
Subtle accentuated enhancement in the wall of the distal common bile
duct as it approaches the ampulla. Subtle accentuated enhancement in
the hepatic parenchyma adjacent to the gallbladder on arterial phase
post-contrast images.

Pancreas: Peripancreatic edema compatible with acute pancreatitis.
No abscess, pseudocyst, or pancreatic necrosis.

Spleen:  Unremarkable

Adrenals/Urinary Tract:  Unremarkable

Stomach/Bowel: Mild secondary inflammatory findings in the
descending duodenum with trace surrounding edema.

Vascular/Lymphatic:  Unremarkable

Other:  No supplemental non-categorized findings.

Musculoskeletal: Unremarkable
IMPRESSION: 1. Gallbladder wall thickening with pericholecystic fluid, at least
2 small gallstones, and faintly accentuated enhancement to the
hepatic parenchyma adjacent to the gallbladder on arterial phase
images. The appearance raises suspicion for acute cholecystitis.
2. No choledocholithiasis is identified. Very faintly accentuated
enhancement of the wall of the distal CBD but with normal expected
conical tapering approaching the ampulla.
3. Acute pancreatitis with peripancreatic edema and potentially some
mild secondary inflammation of the descending duodenum. No findings
of pancreatic abscess, necrosis, or pseudocyst.
4. Mild periportal edema.

## 2021-09-30 ENCOUNTER — Ambulatory Visit (LOCAL_COMMUNITY_HEALTH_CENTER): Payer: Medicaid Other | Admitting: Family Medicine

## 2021-09-30 ENCOUNTER — Encounter: Payer: Self-pay | Admitting: Family Medicine

## 2021-09-30 ENCOUNTER — Encounter (LOCAL_COMMUNITY_HEALTH_CENTER): Payer: Medicaid Other | Admitting: Family Medicine

## 2021-09-30 ENCOUNTER — Other Ambulatory Visit: Payer: Self-pay

## 2021-09-30 VITALS — BP 125/86 | Ht 60.0 in | Wt 211.8 lb

## 2021-09-30 DIAGNOSIS — Z1272 Encounter for screening for malignant neoplasm of vagina: Secondary | ICD-10-CM

## 2021-09-30 DIAGNOSIS — Z309 Encounter for contraceptive management, unspecified: Secondary | ICD-10-CM | POA: Diagnosis not present

## 2021-09-30 DIAGNOSIS — Z3046 Encounter for surveillance of implantable subdermal contraceptive: Secondary | ICD-10-CM | POA: Diagnosis not present

## 2021-09-30 DIAGNOSIS — Z113 Encounter for screening for infections with a predominantly sexual mode of transmission: Secondary | ICD-10-CM

## 2021-09-30 DIAGNOSIS — Z01419 Encounter for gynecological examination (general) (routine) without abnormal findings: Secondary | ICD-10-CM

## 2021-09-30 LAB — WET PREP FOR TRICH, YEAST, CLUE
Trichomonas Exam: NEGATIVE
Yeast Exam: NEGATIVE

## 2021-09-30 LAB — HM HIV SCREENING LAB: HM HIV Screening: NEGATIVE

## 2021-09-30 NOTE — Progress Notes (Signed)
Patient here for PE and nexplanon removal. Thinks her last Pap was 2017. Condom consent signed.Jenetta Downer, RN  ?

## 2021-09-30 NOTE — Progress Notes (Signed)
[facility] DEPARTMENT ?[facility] ?[address] [employer] ?Main Number: [phone] ? ? ? ?Family Planning Visit- Initial Visit ? ?Subjective:  ?Kendra Santos is a 31 y.o.  G2P1102   being seen today for an initial annual visit and to discuss reproductive life planning.  The patient is currently using Hormonal Implant for pregnancy prevention. Patient reports   does not want a pregnancy in the next year.  Patient has the following medical conditions has Left breast abscess; Indication for care in labor and delivery, antepartum; and Acute pancreatitis on their problem list. ? ?Chief Complaint  ?Patient presents with  ? Annual Exam  ? ? ?Patient reports here for physical and nexplanon remvoal ? ?Patient denies any other concerns   ? ?Body mass index is 41.36 kg/m?. - Patient is eligible for diabetes screening based on BMI and age >61?  not applicable ?HA1C ordered? not applicable ? ?Patient reports 1  partner/s in last year. Desires STI screening?  Yes ? ?Has patient been screened once for HCV in the past?  No ? No results found for: HCVAB ? ?Does the patient have current drug use (including MJ), have a partner with drug use, and/or has been incarcerated since last result? No  ?If yes-- Screen for HCV through [facility] ?  ?Does the patient meet criteria for HBV testing? No ? ?Criteria:  ?-Household, sexual or needle sharing contact with HBV ?-History of drug use ?-HIV positive ?-Those with known Hep C ? ? ?Health Maintenance Due  ?Topic Date Due  ? COVID-19 Vaccine (1) Never done  ? Hepatitis C Screening  Never done  ? TETANUS/TDAP  Never done  ? PAP-Cervical Cytology Screening  Never done  ? PAP SMEAR-Modifier  Never done  ? INFLUENZA VACCINE  02/24/2021  ? ? ?Review of Systems  ?Constitutional:  Negative for chills, fever, malaise/fatigue and weight loss.  ?HENT:  Negative for congestion, hearing loss and sore throat.   ?Eyes:  Negative for blurred vision, double vision and  photophobia.  ?Respiratory:  Negative for shortness of breath.   ?Cardiovascular:  Negative for chest pain.  ?Gastrointestinal:  Negative for abdominal pain, blood in stool, constipation, diarrhea, heartburn, nausea and vomiting.  ?Genitourinary:  Negative for dysuria and frequency.  ?Musculoskeletal:  Negative for back pain, joint pain and neck pain.  ?Skin:  Negative for itching and rash.  ?Neurological:  Negative for dizziness, weakness and headaches.  ?Endo/Heme/Allergies:  Does not bruise/bleed easily.  ?Psychiatric/Behavioral:  Negative for depression, substance abuse and suicidal ideas.   ? ?The following portions of the patient's history were reviewed and updated as appropriate: allergies, current medications, past family history, past medical history, past social history, past surgical history and problem list. Problem list updated. ? ? ?See flowsheet for other program required questions. ? ?Objective:  ? ?Vitals:  ? 09/30/21 1437  ?BP: 125/86  ?Weight: 211 lb 12.8 oz (96.1 kg)  ?Height: 5' (1.524 m)  ? ? ?Physical Exam ?Vitals and nursing note reviewed.  ?Constitutional:   ?   Appearance: Normal appearance. She is obese.  ?HENT:  ?   Head: Normocephalic and atraumatic.  ?   Mouth/Throat:  ?   Mouth: Mucous membranes are moist.  ?   Dentition: Normal dentition. No dental caries.  ?   Pharynx: No oropharyngeal exudate or posterior oropharyngeal erythema.  ?Eyes:  ?   General: No scleral icterus. ?Neck:  ?   Thyroid: No thyroid mass, thyromegaly or thyroid tenderness.  ?  Cardiovascular:  ?   Rate and Rhythm: Normal rate and regular rhythm.  ?   Pulses: Normal pulses.  ?   Heart sounds: Normal heart sounds.  ?Pulmonary:  ?   Effort: Pulmonary effort is normal.  ?   Breath sounds: Normal breath sounds.  ?Chest:  ?Breasts: ?   Tanner Score is 5.  ?   Right: Normal. No swelling, inverted nipple, mass, nipple discharge, skin change or tenderness.  ?   Left: Normal. No swelling, inverted nipple, mass, nipple  discharge, skin change or tenderness.  ?Abdominal:  ?   General: Abdomen is flat. Bowel sounds are normal.  ?   Palpations: Abdomen is soft.  ?Genitourinary: ?   General: Normal vulva.  ?   Rectum: Normal.  ?   Comments: External genitalia without, lice, nits, erythema, edema , lesions or inguinal adenopathy. Vagina with normal mucosa and  yellowish discharge and pH equals 4.  Cervix without visual lesions, uterus firm, mobile, non-tender, no masses, CMT adnexal fullness or tenderness.  ? ?Musculoskeletal:     ?   General: Normal range of motion.  ?   Cervical back: Normal range of motion and neck supple.  ?Skin: ?   General: Skin is warm and dry.  ?Neurological:  ?   General: No focal deficit present.  ?   Mental Status: She is alert.  ? ? ? ? ?Assessment and Plan:  ?Kendra Santos is a 31 y.o. female presenting to the [facility] Department for an initial annual wellness/contraceptive visit ? ?Contraception counseling: Reviewed all forms of birth control options in the tiered based approach. available including abstinence; over the counter/barrier methods; hormonal contraceptive medication including pill, patch, ring, injection,contraceptive implant, ECP; hormonal and nonhormonal IUDs; permanent sterilization options including vasectomy and the various tubal sterilization modalities. Risks, benefits, and typical effectiveness rates were reviewed.  Questions were answered.  Written information was also given to the patient to review.  Patient desires No Method - Other Reason, nexplanon removed today.  ? ? ?The patient will follow up in  as needed for surveillance. Information given for BTL.   The patient was told to call with any further questions, or with any concerns about this method of contraception.  Emphasized use of condoms 100% of the time for STI prevention. ? ?Patient was not offered ECP based on ast sex was 3 months  ? ?1. Smear, vaginal, as part of routine gynecological examination ?Well  woman exam today  ?Pap today  ?CBE today  ?- IGP, rfx Aptima HPV ASCU ? ?2. Nexplanon removal ? ?Patient identified, informed consent performed, consent signed.   Appropriate time out taken. Nexplanon site identified.  Area prepped in usual sterile fashon. 3 ml of 1% lidocaine with Epinephrine was used to anesthetize the area at the distal end of the implant and along implant site. A small stab incision was made right beside the implant on the distal portion.  The Nexplanon rod was grasped using manual and removed without difficulty.  There was minimal blood loss. There were no complications.  Steri-strips were applied over the small incision.  A pressure bandage was applied to reduce any bruising.  The patient tolerated the procedure well and was given post procedure instructions. ? ?Counseled patient to take OTC analgesic starting as soon as lidocaine starts to wear off and take regularly for at least 48 hr to decrease discomfort.  Specifically to take with food or milk to decrease stomach upset and for IB  600 mg (3 tablets) every 6 hrs; IB 800 mg (4 tablets) every 8 hrs; or Aleve 2 tablets every 12 hrs. ? ? ?3. Screening examination for venereal disease ?Patient accepted all screenings including wet prep, vaginal CT/GC and bloodwork for HIV/RPR.  ? ? ?Wet prep results neg    ? No Treatment needed  ?Discussed time line for [facility] results and that patient will be called with positive results and encouraged patient to call if she had not heard in 2 weeks.  ?Counseled to return or seek care for continued or worsening symptoms ?Recommended condom use with all sex ? ?Patient is currently using  no BCM   to prevent pregnancy.  ?- HIV [facility] ?- Syphilis Serology, [facility] ?- Chlamydia/Gonorrhea [facility] ?- WET PREP FOR TRICH, YEAST, CLUE ? ? ? ? ?No follow-ups on file. ? ?No future appointments. ? ?Wendi Snipes, FNP ? ?

## 2021-09-30 NOTE — Progress Notes (Signed)
See other note with same date.   Raymar Joiner, FNP  

## 2021-09-30 NOTE — Progress Notes (Signed)
Patient here for PE and nexplanon removal, see other encounter.Burt Knack, RN  ?

## 2021-10-03 LAB — IGP, RFX APTIMA HPV ASCU: PAP Smear Comment: 0

## 2021-11-14 ENCOUNTER — Emergency Department
Admission: EM | Admit: 2021-11-14 | Discharge: 2021-11-14 | Disposition: A | Discharge status: Home / Self Care | Payer: Medicaid Other | Attending: Emergency Medicine | Admitting: Emergency Medicine

## 2021-11-14 ENCOUNTER — Other Ambulatory Visit: Payer: Self-pay

## 2021-11-14 DIAGNOSIS — H9209 Otalgia, unspecified ear: Secondary | ICD-10-CM | POA: Diagnosis not present

## 2021-11-14 DIAGNOSIS — J020 Streptococcal pharyngitis: Secondary | ICD-10-CM | POA: Diagnosis not present

## 2021-11-14 DIAGNOSIS — J029 Acute pharyngitis, unspecified: Secondary | ICD-10-CM | POA: Diagnosis present

## 2021-11-14 DIAGNOSIS — Z20822 Contact with and (suspected) exposure to covid-19: Secondary | ICD-10-CM | POA: Insufficient documentation

## 2021-11-14 LAB — GROUP A STREP BY PCR: Group A Strep by PCR: DETECTED — AB

## 2021-11-14 LAB — RESP PANEL BY RT-PCR (FLU A&B, COVID) ARPGX2
Influenza A by PCR: NEGATIVE
Influenza B by PCR: NEGATIVE
SARS Coronavirus 2 by RT PCR: NEGATIVE

## 2021-11-14 MED ORDER — AZITHROMYCIN 250 MG PO TABS: ORAL_TABLET | ORAL | 0 refills | Status: AC

## 2021-11-14 NOTE — ED Triage Notes (Signed)
Pt to ED for sore throat since this morning, HA since 2 days ago, fever since yesterday (did not take temp at home). Has been taking tylenol. Denies cough, chest pain, SOB, diarrhea. ? ?Vomited last night 2 times and had chills last night. No vomiting today. ? ?Last tylenol today was at 0500. ?Temp currently 99.1. ?

## 2021-11-14 NOTE — ED Provider Notes (Signed)
? ?[facility] ?Provider Note ? ? Event Date/Time  ? First MD Initiated Contact with Patient 11/14/21 1648   ?  (approximate) ?History  ?Sore Throat ? ?HPI ?Kendra Santos is a 31 y.o. female with no stated past medical history presents for sore throat over the last 24 hours with associated ear pain and subjective fevers/chills.  Patient states that she has been taking Tylenol for this pain.  Patient also states that she vomited twice last night but has been p.o. tolerant so far today.  Patient denies any recent travel, sick contacts, cough, chest pain, shortness of breath, or diarrhea ?Physical Exam  ?Triage Vital Signs: ?ED Triage Vitals  ?Enc Vitals Group  ?   BP 11/14/21 1637 126/87  ?   Pulse Rate 11/14/21 1637 (!) 114  ?   Resp 11/14/21 1637 18  ?   Temp 11/14/21 1637 99.1 ?F (37.3 ?C)  ?   Temp Source 11/14/21 1637 Oral  ?   SpO2 11/14/21 1637 96 %  ?   Weight 11/14/21 1638 210 lb (95.3 kg)  ?   Height 11/14/21 1638 5\' 2"  (1.575 m)  ?   Head Circumference --   ?   Peak Flow --   ?   Pain Score 11/14/21 1638 7  ?   Pain Loc --   ?   Pain Edu? --   ?   Excl. in GC? --   ? ?Most recent vital signs: ?Vitals:  ? 11/14/21 1637  ?BP: 126/87  ?Pulse: (!) 114  ?Resp: 18  ?Temp: 99.1 ?F (37.3 ?C)  ?SpO2: 96%  ? ?General: Awake, oriented x4. ?CV:  Good peripheral perfusion.  ?Resp:  Normal effort.  ?Abd:  No distention.  ?Other:  Middle-aged overweight Hispanic female laying in bed in no distress.  Posterior oropharyngeal erythema.  Erythema to bilateral TMs without bulging or fluid behind the membrane ?ED Results / Procedures / Treatments  ?Labs ?(all labs ordered are listed, but only abnormal results are displayed) ?Labs Reviewed  ?GROUP A STREP BY PCR - Abnormal; Notable for the following components:  ?    Result Value  ? Group A Strep by PCR DETECTED (*)   ? All other components within normal limits  ?RESP PANEL BY RT-PCR (FLU A&B, COVID) [id number]  ?PROCEDURES: ?Critical Care performed:  No ?Procedures ?MEDICATIONS ORDERED IN ED: ?Medications - No data to display ?IMPRESSION / MDM / ASSESSMENT AND PLAN / ED COURSE  ?I reviewed the triage vital signs and the nursing notes. ?             ?               ? ?31 year old female presents for sore throat ?No history of immunocompromise. Nontoxic appearance. Patient euvolemic with no trismus. No airway compromise. No change in voice, exudates, enlarged lymph nodes. Able to tolerate PO. Given History and Exam I have low suspicion for this presentation being caused by PTA, RPA, Ludwigs angina, Epiglottitis or Bacterial Tracheitis, EBV, acute HIV, or Strep throat. ? ?Dispo: Discharge home care ? ?  ?FINAL CLINICAL IMPRESSION(S) / ED DIAGNOSES  ? ?Final diagnoses:  ?Strep pharyngitis  ?Sore throat  ? ?Rx / DC Orders  ? ?ED Discharge Orders   ? ?      Ordered  ?  azithromycin (ZITHROMAX Z-PAK) 250 MG tablet       ? 11/14/21 1729  ? ?  ?  ? ?  ? ?Note:  This  document was prepared using Conservation officer, historic buildings and may include unintentional dictation errors. ?  ?Merwyn Katos, MD ?11/14/21 1802 ? ?

## 2021-11-14 NOTE — Discharge Instructions (Addendum)
Please use over-the-counter her Chloraseptic spray for any continued sore throat.  You may also use salt water gargles. ?

## 2022-03-12 ENCOUNTER — Emergency Department: Payer: Medicaid Other

## 2022-03-12 ENCOUNTER — Emergency Department
Admission: EM | Admit: 2022-03-12 | Discharge: 2022-03-13 | Disposition: A | Discharge status: Home / Self Care | Payer: Medicaid Other | Attending: Emergency Medicine | Admitting: Emergency Medicine

## 2022-03-12 ENCOUNTER — Other Ambulatory Visit: Payer: Self-pay

## 2022-03-12 ENCOUNTER — Encounter: Payer: Self-pay | Admitting: *Deleted

## 2022-03-12 DIAGNOSIS — M5431 Sciatica, right side: Secondary | ICD-10-CM

## 2022-03-12 DIAGNOSIS — M5442 Lumbago with sciatica, left side: Secondary | ICD-10-CM | POA: Insufficient documentation

## 2022-03-12 DIAGNOSIS — M5136 Other intervertebral disc degeneration, lumbar region: Secondary | ICD-10-CM | POA: Diagnosis not present

## 2022-03-12 DIAGNOSIS — M5441 Lumbago with sciatica, right side: Secondary | ICD-10-CM | POA: Insufficient documentation

## 2022-03-12 DIAGNOSIS — M545 Low back pain, unspecified: Secondary | ICD-10-CM | POA: Diagnosis present

## 2022-03-12 LAB — POC URINE PREG, ED: Preg Test, Ur: NEGATIVE

## 2022-03-12 NOTE — ED Provider Notes (Signed)
[facility] Provider Note  Patient Contact: 9:01 PM (approximate)   History   Back Pain and Leg Pain   HPI  Kendra Santos is a 31 y.o. female who presents the emergency department with lower back pain radiating down both legs.  Patient states that symptoms have been going on for 4 to 5 weeks.  She was seen in urgent care, had an x-ray which was reassuring and placed on Flexeril and Naprosyn.  Patient states that it helped somewhat but the symptoms remains.  She still having pain radiating down both legs.  No bowel or bladder dysfunction, saddle anesthesia or paresthesias.  Patient denies any direct trauma to her spine.  No urinary or GI complaints.     Physical Exam   Triage Vital Signs: ED Triage Vitals  Enc Vitals Group     BP 03/12/22 2042 134/85     Pulse Rate 03/12/22 2042 68     Resp 03/12/22 2042 20     Temp 03/12/22 2042 98.4 F (36.9 C)     Temp Source 03/12/22 2042 Oral     SpO2 03/12/22 2042 98 %     Weight 03/12/22 2042 210 lb (95.3 kg)     Height 03/12/22 2042 5\' 3"  (1.6 m)     Head Circumference --      Peak Flow --      Pain Score 03/12/22 2047 5     Pain Loc --      Pain Edu? --      Excl. in GC? --     Most recent vital signs: Vitals:   03/12/22 2042  BP: 134/85  Pulse: 68  Resp: 20  Temp: 98.4 F (36.9 C)  SpO2: 98%     General: Alert and in no acute distress.  Cardiovascular:  Good peripheral perfusion Respiratory: Normal respiratory effort without tachypnea or retractions. Lungs CTAB.  Musculoskeletal: Full range of motion to all extremities.  No visible signs of trauma to the lumbar spine.  Patient has some tenderness in the SI joints and sciatic notches bilaterally.  Negative straight leg raise bilaterally.  Good range of motion to the bilateral lower extremities.  Sensation and pulses intact distally. Neurologic:  No gross focal neurologic deficits are appreciated.  Skin:   No rash noted Other:   ED  Results / Procedures / Treatments   Labs (all labs ordered are listed, but only abnormal results are displayed) Labs Reviewed  POC URINE PREG, ED     EKG     RADIOLOGY  I personally viewed, evaluated, and interpreted these images as part of my medical decision making, as well as reviewing the written report by the radiologist.  ED Provider Interpretation: Degeneration noted in the L4-L5 and L5-S1 region without central cord impingement  CT Lumbar Spine Wo Contrast  Result Date: 03/12/2022 CLINICAL DATA:  Lumbar radiculopathy. Lower back pain that radiates to the both legs. Patient taking over the counter medication without relief. EXAM: CT LUMBAR SPINE WITHOUT CONTRAST TECHNIQUE: Multidetector CT imaging of the lumbar spine was performed without intravenous contrast administration. Multiplanar CT image reconstructions were also generated. RADIATION DOSE REDUCTION: This exam was performed according to the departmental dose-optimization program which includes automated exposure control, adjustment of the mA and/or kV according to patient size and/or use of iterative reconstruction technique. COMPARISON:  CT examination dated August 29, 2017. FINDINGS: Segmentation: 5 lumbar type vertebrae. Alignment: Normal. Vertebrae: No acute fracture or focal pathologic process. Paraspinal and other  soft tissues: Negative. Disc levels: T12-L1: No significant finding. L1-L2: No significant finding. L2-L3: No significant findings L3-L4: No significant disc bulge, spinal canal or neural foraminal stenosis. L4-L5: Mild disc height loss and mild circumferential disc bulge. Mild facet joint arthropathy. No significant spinal canal or neural foraminal stenosis. L5-S1: Asymmetric disc bulge to the right with mild lateral recess stenosis. Mild bilateral facet joint arthropathy. No significant spinal canal or neural foraminal stenosis. IMPRESSION: 1.  No acute fracture or traumatic subluxation. 2. Degenerate disc  disease most prominent at L5-S1 with mild right lateral recess stenosis. Mild facet joint arthropathy at L4-L5 and L5-S1. Electronically Signed   By: Keane Police D.O.   On: 03/12/2022 23:43    PROCEDURES:  Critical Care performed: No  Procedures   MEDICATIONS ORDERED IN ED: Medications  ketorolac (TORADOL) 30 MG/ML injection 30 mg (has no administration in time range)  dexamethasone (DECADRON) injection 10 mg (has no administration in time range)  methocarbamol (ROBAXIN) tablet 1,000 mg (has no administration in time range)     IMPRESSION / MDM / ASSESSMENT AND PLAN / ED COURSE  I reviewed the triage vital signs and the nursing notes.                              Differential diagnosis includes, but is not limited to, lumbago, sciatica, herniated disc, bulging disc, cauda equina  Patient's presentation is most consistent with acute presentation with potential threat to life or bodily function.   Patient's diagnosis is consistent with sciatica, degenerative disc disease of the lumbar spine.  Patient presented to the ED complaining of ongoing lower back pain.  She had some intermittent back pain over the years but has had continuous back pain for the last 5 weeks.  She does have radicular symptoms down the legs but no concerning neuro deficits.  Patient had an x-ray at the beginning of symptoms which did not reveal any acute findings.  As such I did repeat imaging but ordered CT scan to further evaluate the area.  She does have some degeneration in the lumbar spine with her L4-S1 region.  There is no central cord impingement and again patient is neurologically intact.  No indication for MRI or urgent neurosurgery consultation.  At this time patient will be treated with anti-inflammatory, prednisone, muscle relaxer..  Concerning signs and symptoms to return to the ED are discussed with the patient.  Patient is given ED precautions to return to the ED for any worsening or new symptoms.         FINAL CLINICAL IMPRESSION(S) / ED DIAGNOSES   Final diagnoses:  Bilateral sciatica  Lumbar degenerative disc disease     Rx / DC Orders   ED Discharge Orders          Ordered    meloxicam (MOBIC) 15 MG tablet  Daily        03/13/22 0001    methocarbamol (ROBAXIN) 500 MG tablet  4 times daily        03/13/22 0001    predniSONE (DELTASONE) 10 MG tablet  As directed       Note to Pharmacy: Take on a pattern of 6, 6, 5, 5, 4, 4, 3, 3, 2, 2, 1, 1   03/13/22 0001             Note:  This document was prepared using Dragon voice recognition software and may include unintentional dictation errors.  Racheal Patches, PA-C 03/13/22 0001    Minna Antis, MD 03/13/22 2242

## 2022-03-12 NOTE — ED Triage Notes (Signed)
Pt reports pain lower back and pain radiates into both lower legs.  Pt taking otc meds without relief.  No known injury to back/legs.  Pt was seen last month at urgent care with similar sx.  Pt alert

## 2022-03-13 MED ORDER — METHOCARBAMOL 500 MG PO TABS: 500.0000 mg | ORAL_TABLET | Freq: Four times a day (QID) | ORAL | 0 refills | Status: DC

## 2022-03-13 MED ORDER — KETOROLAC TROMETHAMINE 30 MG/ML IJ SOLN
30.0000 mg | Freq: Once | INTRAMUSCULAR | Status: AC
  Administered 2022-03-13: 30 mg via INTRAMUSCULAR
  Filled 2022-03-13: qty 1

## 2022-03-13 MED ORDER — METHOCARBAMOL 500 MG PO TABS
1000.0000 mg | ORAL_TABLET | Freq: Once | ORAL | Status: AC
  Administered 2022-03-13: 1000 mg via ORAL
  Filled 2022-03-13: qty 2

## 2022-03-13 MED ORDER — MELOXICAM 15 MG PO TABS: 15.0000 mg | ORAL_TABLET | Freq: Every day | ORAL | 0 refills | Status: DC

## 2022-03-13 MED ORDER — DEXAMETHASONE SODIUM PHOSPHATE 10 MG/ML IJ SOLN
10.0000 mg | Freq: Once | INTRAMUSCULAR | Status: AC
  Administered 2022-03-13: 10 mg via INTRAMUSCULAR
  Filled 2022-03-13: qty 1

## 2022-03-13 MED ORDER — PREDNISONE 10 MG PO TABS: 10.0000 mg | ORAL_TABLET | ORAL | 0 refills | Status: DC

## 2022-03-27 NOTE — Progress Notes (Signed)
Referring Physician:  Minna Antis, MD [address] [place],  [place] [address]  Primary Physician:  Patient, No Pcp Per  History of Present Illness: 04/02/2022 [name] is here for ED follow up. Seen in ED on 03/12/22 for 5 week history of LBP with bilateral leg pain.   History of chronic intermittent LBP x years with 2 and 1/2 month history of more constant left > right  leg pain posterior to her feet. She has numbness and tingling in both legs. No weakness in legs.   Pain is worse with prolonged walking (especially on concrete). LBP is worse when she is cold (AC blows on her). No known alleviating factors.   Given mobic, robaxin, and prednisone (tapered dose x 12 days). She does not think any of these help. She is still taking mobic and robaxin.   Has a 30 year old and 30 year old boys. She stays home with her 30 year old.    Conservative measures:  Physical therapy: no  Multimodal medical therapy including regular antiinflammatories: robaxin, prednisone, mobic Injections: No epidural steroid injections  Past Surgery: None on spine  Carlisle Beers Kendra Santos has   no symptoms of cervical myelopathy.  The symptoms are causing a significant impact on the patient's life.   Review of Systems:  A 10 point review of systems is negative, except for the pertinent positives and negatives detailed in the HPI.  Past Medical History: Past Medical History:  Diagnosis Date   Breast abscess    Hypertension     Past Surgical History: Past Surgical History:  Procedure Laterality Date   CHOLECYSTECTOMY N/A 04/14/2019   Procedure: LAPAROSCOPIC CHOLECYSTECTOMY;  Surgeon: Duanne Guess, MD;  Location: [facility] ORS;  Service: General;  Laterality: N/A;   sebaceous cyst excision  2015   Back    Allergies: Allergies as of 04/02/2022   (No Known Allergies)    Medications: Outpatient Encounter Medications as of 04/02/2022  Medication Sig   meloxicam (MOBIC) 15 MG tablet Take 1  tablet (15 mg total) by mouth daily.   [DISCONTINUED] predniSONE (DELTASONE) 10 MG tablet Take 1 tablet (10 mg total) by mouth as directed.   [DISCONTINUED] methocarbamol (ROBAXIN) 500 MG tablet Take 1 tablet (500 mg total) by mouth 4 (four) times daily. (Patient not taking: Reported on 04/02/2022)   [DISCONTINUED] ondansetron (ZOFRAN) 4 MG tablet Take 1 tablet (4 mg total) by mouth every 6 (six) hours as needed for nausea. (Patient not taking: Reported on 09/30/2021)   No facility-administered encounter medications on file as of 04/02/2022.    Social History: Social History   Tobacco Use   Smoking status: Never    Passive exposure: Never   Smokeless tobacco: Never  Vaping Use   Vaping Use: Never used  Substance Use Topics   Alcohol use: Not Currently    Comment: last alcohol 2013   Drug use: No    Family Medical History: Family History  Problem Relation Age of Onset   Hypertension Mother    Diabetes Mother    Asthma Brother     Physical Examination: Vitals:   04/02/22 1008  BP: 128/82    General: Patient is well developed, well nourished, calm, collected, and in no apparent distress. Attention to examination is appropriate.  Respiratory: Patient is breathing without any difficulty.   NEUROLOGICAL:     Awake, alert, oriented to person, place, and time.  Speech is clear and fluent. Fund of knowledge is appropriate.   Cranial Nerves:  Pupils equal round and reactive to light.  Facial tone is symmetric.  Facial sensation is symmetric.  ROM of spine:  Limited ROM of lumbar spine with pain on forward flexion.   Mild central lower lumbar tenderness.   No abnormal lesions on exposed skin.   Strength: Side Biceps Triceps Deltoid Interossei Grip Wrist Ext. Wrist Flex.  R 5 5 5 5 5 5 5   L 5 5 5 5 5 5 5    Side Iliopsoas Quads Hamstring PF DF EHL  R 5 5 5 5 5 5   L 5 5 5 5 5 5    Reflexes are 2+ and symmetric at the biceps, triceps, brachioradialis, patella and achilles.    Hoffman's is absent.  Clonus is not present.   Bilateral upper and lower extremity sensation is intact to light touch.    No evidence of dysmetria noted.  Gait is normal.    Medical Decision Making  Imaging: CT lumbar spine 03/12/22:  FINDINGS: Segmentation: 5 lumbar type vertebrae.   Alignment: Normal.   Vertebrae: No acute fracture or focal pathologic process.   Paraspinal and other soft tissues: Negative.   Disc levels:   T12-L1: No significant finding.   L1-L2: No significant finding.   L2-L3: No significant findings   L3-L4: No significant disc bulge, spinal canal or neural foraminal stenosis.   L4-L5: Mild disc height loss and mild circumferential disc bulge. Mild facet joint arthropathy. No significant spinal canal or neural foraminal stenosis.   L5-S1: Asymmetric disc bulge to the right with mild lateral recess stenosis. Mild bilateral facet joint arthropathy. No significant spinal canal or neural foraminal stenosis.   IMPRESSION: 1.  No acute fracture or traumatic subluxation.   2. Degenerate disc disease most prominent at L5-S1 with mild right lateral recess stenosis. Mild facet joint arthropathy at L4-L5 and L5-S1.     Electronically Signed   By: D.O.   On: 03/12/2022 23:43    I have personally reviewed the images and agree with the above interpretation.  Assessment and Plan: Kendra Santos is a pleasant 31 y.o. female with history of chronic intermittent LBP x years with 2 and 1/2 month history of more constant left > right  leg pain posterior to her feet.   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   Above treatment options discussed with patient and following plan made:   - Order for physical therapy for lumbar spine sent to [facility]. - Stop mobic and robaxin as they are not helping.  - New prescription for motrin. Reviewed proper dosing along with risks and benefits. Take with food.  - New prescription for flexeril to use prn.  Reviewed dosing and side effects. Can make her sleepy.  - Follow up in 3 months for recheck. If no improvement, may consider lumbar MRI.   I spent a total of 30 minutes in face-to-face and non-face-to-face activities related to this patient's care today.  Thank you for involving me in the care of this patient.   03/14/2022 PA-C Dept. of Neurosurgery

## 2022-04-02 ENCOUNTER — Ambulatory Visit: Payer: Medicaid Other | Admitting: Orthopedic Surgery

## 2022-04-02 ENCOUNTER — Encounter: Payer: Self-pay | Admitting: Orthopedic Surgery

## 2022-04-02 VITALS — BP 128/82 | Ht 63.0 in | Wt 209.4 lb

## 2022-04-02 DIAGNOSIS — M5441 Lumbago with sciatica, right side: Secondary | ICD-10-CM

## 2022-04-02 DIAGNOSIS — G8929 Other chronic pain: Secondary | ICD-10-CM

## 2022-04-02 DIAGNOSIS — M5136 Other intervertebral disc degeneration, lumbar region: Secondary | ICD-10-CM | POA: Diagnosis not present

## 2022-04-02 DIAGNOSIS — M5416 Radiculopathy, lumbar region: Secondary | ICD-10-CM

## 2022-04-02 DIAGNOSIS — M47816 Spondylosis without myelopathy or radiculopathy, lumbar region: Secondary | ICD-10-CM | POA: Diagnosis not present

## 2022-04-02 DIAGNOSIS — M5442 Lumbago with sciatica, left side: Secondary | ICD-10-CM | POA: Diagnosis not present

## 2022-04-02 MED ORDER — IBUPROFEN 800 MG PO TABS: 800.0000 mg | ORAL_TABLET | Freq: Three times a day (TID) | ORAL | 1 refills | Status: DC | PRN

## 2022-04-02 MED ORDER — CYCLOBENZAPRINE HCL 10 MG PO TABS: 10.0000 mg | ORAL_TABLET | Freq: Three times a day (TID) | ORAL | 0 refills | Status: DC | PRN

## 2022-04-02 NOTE — Patient Instructions (Addendum)
It was so nice to see you today, I am sorry that you are hurting.   Your lower back CT showed some mild wear and tear in your back (degeneration) and I think this may be causing your pain.   I sent physical therapy orders to [facility], they should call you to schedule.   I sent a prescription for motrin to help with pain and inflammation. Take as directed with food. Stop the meloxicam.   I also sent a prescription for cyclobenzaprine to help with muscle spasms. Use only as needed and be careful, this can make you sleepy. Stop the methocarbamol.   I will see you back in 3 months. Please do not hesitate to call if you have any questions or concerns. You can also message me in MyChart.   If you have not heard back about PT in the next week, please call the office so we can help you get these things scheduled.   Drake Leach PA-C [phone]

## 2022-04-16 ENCOUNTER — Ambulatory Visit: Payer: Medicaid Other | Attending: Orthopedic Surgery

## 2022-04-16 DIAGNOSIS — M5459 Other low back pain: Secondary | ICD-10-CM | POA: Insufficient documentation

## 2022-04-16 DIAGNOSIS — M5416 Radiculopathy, lumbar region: Secondary | ICD-10-CM | POA: Diagnosis present

## 2022-04-16 DIAGNOSIS — M5442 Lumbago with sciatica, left side: Secondary | ICD-10-CM | POA: Diagnosis not present

## 2022-04-16 DIAGNOSIS — M6281 Muscle weakness (generalized): Secondary | ICD-10-CM | POA: Insufficient documentation

## 2022-04-16 DIAGNOSIS — M5136 Other intervertebral disc degeneration, lumbar region: Secondary | ICD-10-CM | POA: Insufficient documentation

## 2022-04-16 DIAGNOSIS — M47816 Spondylosis without myelopathy or radiculopathy, lumbar region: Secondary | ICD-10-CM | POA: Diagnosis not present

## 2022-04-16 DIAGNOSIS — G8929 Other chronic pain: Secondary | ICD-10-CM | POA: Diagnosis not present

## 2022-04-16 DIAGNOSIS — M5441 Lumbago with sciatica, right side: Secondary | ICD-10-CM | POA: Insufficient documentation
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
## 2022-04-21 ENCOUNTER — Encounter: Payer: Self-pay | Admitting: Physical Therapy

## 2022-04-21 ENCOUNTER — Ambulatory Visit: Payer: Medicaid Other | Admitting: Physical Therapy

## 2022-04-21 DIAGNOSIS — M5459 Other low back pain: Secondary | ICD-10-CM

## 2022-04-21 DIAGNOSIS — M6281 Muscle weakness (generalized): Secondary | ICD-10-CM

## 2022-04-21 DIAGNOSIS — M5416 Radiculopathy, lumbar region: Secondary | ICD-10-CM
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
## 2022-04-23 ENCOUNTER — Encounter: Payer: Self-pay | Admitting: Physical Therapy

## 2022-04-23 ENCOUNTER — Ambulatory Visit: Payer: Medicaid Other | Admitting: Physical Therapy

## 2022-04-23 DIAGNOSIS — M5459 Other low back pain: Secondary | ICD-10-CM

## 2022-04-23 DIAGNOSIS — M5416 Radiculopathy, lumbar region: Secondary | ICD-10-CM

## 2022-04-23 DIAGNOSIS — M6281 Muscle weakness (generalized): Secondary | ICD-10-CM
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
## 2022-04-28 ENCOUNTER — Ambulatory Visit: Payer: Medicaid Other | Attending: Orthopedic Surgery | Admitting: Physical Therapy

## 2022-04-28 ENCOUNTER — Encounter: Payer: Self-pay | Admitting: Physical Therapy

## 2022-04-28 DIAGNOSIS — M5416 Radiculopathy, lumbar region: Secondary | ICD-10-CM | POA: Insufficient documentation

## 2022-04-28 DIAGNOSIS — M6281 Muscle weakness (generalized): Secondary | ICD-10-CM | POA: Insufficient documentation

## 2022-04-28 DIAGNOSIS — M5459 Other low back pain: Secondary | ICD-10-CM | POA: Diagnosis present
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
## 2022-04-30 ENCOUNTER — Ambulatory Visit: Payer: Medicaid Other | Admitting: Physical Therapy

## 2022-05-05 ENCOUNTER — Encounter: Payer: Self-pay | Admitting: Physical Therapy

## 2022-05-05 ENCOUNTER — Ambulatory Visit: Payer: Medicaid Other | Admitting: Physical Therapy

## 2022-05-05 DIAGNOSIS — M5459 Other low back pain: Secondary | ICD-10-CM | POA: Diagnosis not present

## 2022-05-05 DIAGNOSIS — M5416 Radiculopathy, lumbar region: Secondary | ICD-10-CM
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
## 2022-05-07 ENCOUNTER — Ambulatory Visit: Payer: Medicaid Other | Admitting: Physical Therapy

## 2022-05-07 ENCOUNTER — Encounter: Payer: Self-pay | Admitting: Physical Therapy

## 2022-05-07 DIAGNOSIS — M6281 Muscle weakness (generalized): Secondary | ICD-10-CM

## 2022-05-07 DIAGNOSIS — M5459 Other low back pain: Secondary | ICD-10-CM | POA: Diagnosis not present

## 2022-05-07 DIAGNOSIS — M5416 Radiculopathy, lumbar region: Secondary | ICD-10-CM
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
## 2022-05-12 ENCOUNTER — Ambulatory Visit: Payer: Medicaid Other | Admitting: Physical Therapy

## 2022-05-19 ENCOUNTER — Encounter: Payer: Medicaid Other | Admitting: Physical Therapy

## 2022-05-21 ENCOUNTER — Encounter: Payer: Medicaid Other | Admitting: Physical Therapy

## 2022-05-26 ENCOUNTER — Encounter: Payer: Medicaid Other | Admitting: Physical Therapy

## 2022-05-28 ENCOUNTER — Encounter: Payer: Medicaid Other | Admitting: Physical Therapy
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
## 2022-07-02 ENCOUNTER — Encounter: Payer: Self-pay | Admitting: Orthopedic Surgery

## 2022-07-02 ENCOUNTER — Ambulatory Visit (INDEPENDENT_AMBULATORY_CARE_PROVIDER_SITE_OTHER): Payer: Medicaid Other | Admitting: Orthopedic Surgery

## 2022-07-02 VITALS — BP 122/78 | Ht 63.0 in | Wt 199.2 lb

## 2022-07-02 DIAGNOSIS — M5441 Lumbago with sciatica, right side: Secondary | ICD-10-CM

## 2022-07-02 DIAGNOSIS — M5136 Other intervertebral disc degeneration, lumbar region: Secondary | ICD-10-CM

## 2022-07-02 DIAGNOSIS — M5442 Lumbago with sciatica, left side: Secondary | ICD-10-CM | POA: Diagnosis not present

## 2022-07-02 DIAGNOSIS — M47816 Spondylosis without myelopathy or radiculopathy, lumbar region: Secondary | ICD-10-CM | POA: Diagnosis not present

## 2022-07-02 DIAGNOSIS — G8929 Other chronic pain: Secondary | ICD-10-CM

## 2022-08-05 NOTE — Progress Notes (Signed)
   Telephone Visit- Progress Note: Referring Physician:  No referring provider defined for this encounter.  Primary Physician:  Patient, No Pcp Per  This visit was performed via telephone.  Patient location: home Provider location: office  I spent a total of 15 minutes non-face-to-face activities for this visit on the date of this encounter including review of current clinical condition and response to treatment.    Patient has given verbal consent to this telephone visits and we reviewed the limitations of a telephone visit. Patient wishes to proceed.    Chief Complaint:  recheck visit  History of Present Illness: Sharniece Gibbon is healthy.   Last seen by me on 07/02/22 and was doing well with only some stiffness in her lower back. Her right leg pain was better. 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    She was to continue with HEP from PT and continue on prn motrin. She was to keep working on weight loss.   She continues with more constant LBP and left > right posterior leg pain to her feet that is worse in the morning. LBP is more of a stiffness. Pain improves as the day progresses. She is still doing HEP from PT. She is taking prn motrin. Flexeril made her too sleepy.   Still working on weight loss- thinks she may have lost a few pounds since last visit.   Exam: No exam done as this was a telephone encounter.     Imaging: No updated imaging.   Assessment and Plan: Ms. Buckle is a 32 y.o. female with more constant LBP with left > right  leg pain posterior to her feet. She has been worse for over 6 months.    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    Treatment options discussed with patient and following plan made:   - MRI of lumbar spine to further evaluate lumbar radiculopathy. No improvement over last 6 months with PT, time, or medications (motrin, mobic, robaxin, flexeril).  - Continue with HEP from PT.  - Continue on prn motrin. Reviewed  proper dosing along with risks and benefits. Take with food.  - Will schedule f/u to see me in office to review MRI results once they are back.    Geronimo Boot PA-C Dept. of Neurosurgery

## 2022-08-12 ENCOUNTER — Ambulatory Visit (INDEPENDENT_AMBULATORY_CARE_PROVIDER_SITE_OTHER): Payer: Medicaid Other | Admitting: Orthopedic Surgery

## 2022-08-12 ENCOUNTER — Encounter: Payer: Self-pay | Admitting: Orthopedic Surgery

## 2022-08-12 DIAGNOSIS — M5136 Other intervertebral disc degeneration, lumbar region: Secondary | ICD-10-CM

## 2022-08-12 DIAGNOSIS — M5442 Lumbago with sciatica, left side: Secondary | ICD-10-CM | POA: Diagnosis not present

## 2022-08-12 DIAGNOSIS — M5441 Lumbago with sciatica, right side: Secondary | ICD-10-CM

## 2022-08-12 DIAGNOSIS — M4726 Other spondylosis with radiculopathy, lumbar region: Secondary | ICD-10-CM | POA: Diagnosis not present

## 2022-08-12 DIAGNOSIS — M5416 Radiculopathy, lumbar region: Secondary | ICD-10-CM

## 2022-08-12 DIAGNOSIS — G8929 Other chronic pain: Secondary | ICD-10-CM

## 2022-08-12 DIAGNOSIS — M47816 Spondylosis without myelopathy or radiculopathy, lumbar region: Secondary | ICD-10-CM

## 2022-09-06 ENCOUNTER — Ambulatory Visit
Admission: RE | Admit: 2022-09-06 | Discharge: 2022-09-06 | Disposition: A | Discharge status: Home / Self Care | Payer: Medicaid Other | Source: Ambulatory Visit | Attending: Orthopedic Surgery | Admitting: Orthopedic Surgery

## 2022-09-06 DIAGNOSIS — M5136 Other intervertebral disc degeneration, lumbar region: Secondary | ICD-10-CM | POA: Diagnosis present

## 2022-09-06 DIAGNOSIS — M5441 Lumbago with sciatica, right side: Secondary | ICD-10-CM | POA: Diagnosis present

## 2022-09-06 DIAGNOSIS — M47816 Spondylosis without myelopathy or radiculopathy, lumbar region: Secondary | ICD-10-CM | POA: Diagnosis present

## 2022-09-06 DIAGNOSIS — G8929 Other chronic pain: Secondary | ICD-10-CM | POA: Diagnosis present

## 2022-09-06 DIAGNOSIS — M5416 Radiculopathy, lumbar region: Secondary | ICD-10-CM | POA: Diagnosis present

## 2022-09-06 DIAGNOSIS — M5442 Lumbago with sciatica, left side: Secondary | ICD-10-CM | POA: Diagnosis present

## 2022-09-06 DIAGNOSIS — M51369 Other intervertebral disc degeneration, lumbar region without mention of lumbar back pain or lower extremity pain: Secondary | ICD-10-CM

## 2022-09-10 ENCOUNTER — Ambulatory Visit: Payer: Medicaid Other | Admitting: Orthopedic Surgery

## 2022-09-10 ENCOUNTER — Encounter: Payer: Self-pay | Admitting: Orthopedic Surgery

## 2022-09-10 VITALS — BP 122/78 | Wt 194.6 lb

## 2022-09-10 DIAGNOSIS — M5126 Other intervertebral disc displacement, lumbar region: Secondary | ICD-10-CM | POA: Diagnosis not present

## 2022-09-10 DIAGNOSIS — M5136 Other intervertebral disc degeneration, lumbar region: Secondary | ICD-10-CM

## 2022-09-10 DIAGNOSIS — M4726 Other spondylosis with radiculopathy, lumbar region: Secondary | ICD-10-CM | POA: Diagnosis not present

## 2022-09-10 DIAGNOSIS — M5416 Radiculopathy, lumbar region: Secondary | ICD-10-CM

## 2022-09-10 DIAGNOSIS — M47816 Spondylosis without myelopathy or radiculopathy, lumbar region: Secondary | ICD-10-CM

## 2022-09-10 NOTE — Patient Instructions (Signed)
It was so nice to see you today. Thank you so much for coming in.    Your MRI shows a disc bulge at L5-S1 that is likely irritating the nerve on the left. This is likely causing your leg pain. The back pain is likely due to the wear and tear in your back.   I want you to see physical medicine and rehab at the [facility] to discuss possible injections in your lower back. Dr. Sharlet Salina, Dr. Alba Destine, and their PA Loree Fee are great and will take good care of you. They should call you to schedule an appointment or you can call them at [phone].   I set up a phone visit with you in 6-8 weeks. Please do not hesitate to call if you have any questions or concerns. You can also message me in [place].   Geronimo Boot PA-C [phone]

## 2022-09-10 NOTE — Progress Notes (Signed)
Referring Physician:  No referring provider defined for this encounter.  Primary Physician:  Patient, No Pcp Per  History of Present Illness: Kendra Santos is healthy.   Last seen by me on 08/12/22 for a phone visit. She continued with more constant LBP and left > right posterior leg pain to her feet that has been worse over the last 6 months.   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    She is here to review her updated lumbar spine MRI.   She continues with constant LBP and left posterior lateral leg pain to her foot. Minimal twinges of right leg pain. Pain is worse with walking and with laying down. She has been through PT and continues with HEP.   Has a 31 year old and 31 year old boys. She stays home with her 31 year old- he is non verbal.   No bowel or bladder issues.    Conservative measures:  Physical therapy: [facility] (6 visits from 04/16/22-05/07/22, discharged as she was doing better) Multimodal medical therapy including regular antiinflammatories: robaxin, prednisone, mobic, motrin, flexeril.  Injections: No epidural steroid injections  Past Surgery: None on spine  Review of Systems:  A 10 point review of systems is negative, except for the pertinent positives and negatives detailed in the HPI.  Past Medical History: Past Medical History:  Diagnosis Date   Breast abscess    Hypertension     Past Surgical History: Past Surgical History:  Procedure Laterality Date   CHOLECYSTECTOMY N/A 04/14/2019   Procedure: LAPAROSCOPIC CHOLECYSTECTOMY;  Surgeon: Fredirick Maudlin, MD;  Location: [facility] ORS;  Service: General;  Laterality: N/A;   sebaceous cyst excision  2015   Back    Allergies: Allergies as of 09/10/2022   (No Known Allergies)    Medications: Outpatient Encounter Medications as of 09/10/2022  Medication Sig   [DISCONTINUED] ibuprofen (ADVIL) 800 MG tablet Take 1 tablet (800 mg total) by mouth 3 (three) times daily with meals as needed for mild  pain.   No facility-administered encounter medications on file as of 09/10/2022.    Social History: Social History   Tobacco Use   Smoking status: Never    Passive exposure: Never   Smokeless tobacco: Never  Vaping Use   Vaping Use: Never used  Substance Use Topics   Alcohol use: Not Currently    Comment: last alcohol 2013   Drug use: No    Family Medical History: Family History  Problem Relation Age of Onset   Hypertension Mother    Diabetes Mother    Asthma Brother     Physical Examination: Vitals:   09/10/22 1334  BP: 122/78    Awake, alert, oriented to person, place, and time.  Speech is clear and fluent. Fund of knowledge is appropriate.   Cranial Nerves: Pupils equal round and reactive to light.    Strength: Side Iliopsoas Quads Hamstring PF DF EHL  R 5 5 5 5 5 5  $ L 5 5 5 5 5 5   $ Clonus is not present.   Bilateral lower extremity sensation is intact to light touch.     Gait is normal.    Medical Decision Making Lumbar MRI dated 09/06/22:  FINDINGS: Segmentation: Standard. Lowest well-formed disc space labeled the L5-S1 level.   Alignment: Physiologic with preservation of the normal lumbar lordosis. No listhesis.   Vertebrae: Vertebral body height maintained without acute or chronic fracture. Diffuse loss of normal bone marrow signal, nonspecific but can be  seen with anemia, smoking, obesity, and infiltrative/myelofibrotic marrow processes. No discrete or worrisome osseous lesions. Mild reactive endplate change present about the L4-5 and L5-S1 interspaces. No abnormal marrow edema.   Conus medullaris and cauda equina: Conus extends to the L2 level. Conus and cauda equina appear normal.   Paraspinal and other soft tissues: Unremarkable.   Disc levels:   L1-2:  Unremarkable.   L2-3:  Unremarkable.   L3-4: Minimal endplate spurring anteriorly. Small anterior annular fissure without significant disc bulge. No spinal stenosis. Foramina remain  patent.   L4-5: Disc desiccation with minimal annular disc bulge. No significant spinal stenosis. Foramina remain patent.   L5-S1: Mild degenerative intervertebral disc space narrowing with disc desiccation and diffuse disc bulge. Associated reactive endplate change with marginal endplate osteophytic spurring. Superimposed small central disc protrusion contacts and/or closely approximates the descending S1 nerve roots as they course through the lateral recesses, either of which could be affected (series 9, image 41). Resultant mild to moderate bilateral subarticular stenosis. Central canal remains patent. Mild bilateral L5 foraminal narrowing.   IMPRESSION: 1. Small central disc protrusion at L5-S1, contacting and/or closely approximating the descending S1 nerve roots as they course through the lateral recesses, either of which could be affected. 2. Mild bilateral L5 foraminal stenosis related to disc bulging and reactive endplate spurring. 3. Minor degenerative disc disease at L3-4 and L4-5 without stenosis or impingement.     Electronically Signed   By: Jeannine Boga M.D.   On: 09/06/2022 19:55   I have personally reviewed the images and agree with the above interpretation.    Assessment and Plan: Kendra Santos is a pleasant 32 y.o. female with constant LBP and left posterior lateral leg pain to her foot. Minimal twinges of right leg pain.  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 Also with disc at L5-S1 that likely abuts S1 nerve with bilateral foraminal stenosis.   Treatment options discussed with patient and following plan made:   - Continue with HEP from PT.  - Continue on prn motrin. Reviewed proper dosing along with risks and benefits. Take with food.  - Referral to PMR at [facility] to consider lumbar injections.  - Follow up for phone visit in 6-8 weeks. If no improvement, may need to consider surgery options.   I spent a total of 20 minutes in face-to-face  and non-face-to-face activities related to this patient's care today.  Geronimo Boot PA-C Dept. of Neurosurgery

## 2022-11-04 NOTE — Progress Notes (Signed)
   Telephone Visit- Progress Note: Referring Physician:  No referring provider defined for this encounter.  Primary Physician:  Patient, No Pcp Per  This visit was performed via telephone.  Patient location: home Provider location: office  I spent a total of 10 minutes non-face-to-face activities for this visit on the date of this encounter including review of current clinical condition and response to treatment.    Patient has given verbal consent to this telephone visits and we reviewed the limitations of a telephone visit. Patient wishes to proceed.    Chief Complaint:  recheck after seeing PMR  History of Present Illness: Kendra Santos is a 32 y.o. female is healthy.   Last seen by me on 09/10/22 for LBP with left leg pain. 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 Also with disc at L5-S1 that likely abuts S1 nerve with bilateral foraminal stenosis.   She was referred to PMR to consider lumbar injections. She had left L5-S1 and left S1 TF ESI by Dr. Yves Dill on 09/23/22.   She was doing well at her f/u with [facility] on 10/13/22.   She had improvement with injection. LBP is better- does not hurt when she bends. She notes some intermittent tightness. She has intermittent left leg (entire leg to foot) pain/tingling with prolonged standing and walking. Right leg pain is better.   She has been getting back to weight lifting and notes this is going well. She can do exercises that she was not able to do previously.   Exam: No exam done as this was a telephone encounter.     Imaging: Nothing new   Assessment and Plan: [name] is a pleasant 32 y.o. female with improvement with injection. LBP is better- does not hurt when she bends. She notes some intermittent tightness. She has intermittent left leg (entire leg to foot) pain/tingling with prolonged standing and walking. Right leg pain is better.   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 Also with disc at  L5-S1 that likely abuts S1 nerve with bilateral foraminal stenosis.   Treatment options discussed with patient and following plan made:   - Continue to exercise/lift weights as tolerated. She is going slow with weights. Avoid anything that aggravates her pain.  - Hold on further ESI at this time. She will call [facility] to schedule this if pain gets worse.  - She will f/u prn with me for now. Happy to see her back as needed.   Drake Leach PA-C Neurosurgery

## 2022-11-06 ENCOUNTER — Ambulatory Visit (INDEPENDENT_AMBULATORY_CARE_PROVIDER_SITE_OTHER): Payer: Medicaid Other | Admitting: Orthopedic Surgery

## 2022-11-06 ENCOUNTER — Encounter: Payer: Self-pay | Admitting: Orthopedic Surgery

## 2022-11-06 DIAGNOSIS — M4726 Other spondylosis with radiculopathy, lumbar region: Secondary | ICD-10-CM | POA: Diagnosis not present

## 2022-11-06 DIAGNOSIS — M47816 Spondylosis without myelopathy or radiculopathy, lumbar region: Secondary | ICD-10-CM

## 2022-11-06 DIAGNOSIS — M5136 Other intervertebral disc degeneration, lumbar region: Secondary | ICD-10-CM | POA: Diagnosis not present

## 2022-11-06 DIAGNOSIS — M5126 Other intervertebral disc displacement, lumbar region: Secondary | ICD-10-CM | POA: Diagnosis not present

## 2022-11-06 DIAGNOSIS — M5416 Radiculopathy, lumbar region: Secondary | ICD-10-CM

## 2023-07-28 NOTE — L&D Delivery Note (Signed)
 Delivery Note  Date of delivery: 07/13/2024 Estimated Date of Delivery: 07/25/24 Patient's last menstrual period was 10/19/2023 (exact date). EGA: [redacted]w[redacted]d  Delivery Note At 3:31 AM a viable female was delivered via Vaginal, Spontaneous (Presentation: Left Occiput Anterior).  APGAR: 9, 9; weight pending.  Placenta status: Spontaneous, Intact.  Cord: 3 vessels with the following complications: None.  Cord pH: n/a  First Stage: Labor onset: unknown Augmentation : Pitocin  Analgesia /Anesthesia intrapartum: none AROM at 0056  Kendra Santos presented to L&D with active labor. She was augmented with pitocin  during the pushing stage.    Second Stage: Complete dilation at 0143 Onset of pushing at 0144 FHR second stage Cat I and Cat II Delivery at 0331 on 07/13/2024  She progressed to complete and had a spontaneous vaginal birth of a live female over an intact perineum. The fetal head was delivered in OA position with restitution to LOA. Nuchal cord around the back of the neck only. Anterior then posterior shoulders delivered spontaneously with minimal assistance. Baby placed on mom's abdomen and attended to by transition RN. Cord clamped and cut after 1+ min by FOB. Cord blood obtained for newborn labs.  Third Stage: Placenta delivered intact with 3VC at [phone] Placenta disposition: routine disposal  Uterine tone firm / bleeding min IV pitocin  given for hemorrhage prophylaxis  Anesthesia: None Episiotomy: None Lacerations: None Suture Repair: n/a Est. Blood Loss (mL): 300    Complications: none  Mom to postpartum.  Baby to Couplet care / Skin to Skin.  Newborn: Birth Weight: pending  Apgar Scores: 9, 9 Feeding planned: Both   Kendra Santos, CNM 07/13/2024 3:55 AM

## 2023-09-06 ENCOUNTER — Emergency Department: Payer: Medicaid Other

## 2023-09-06 ENCOUNTER — Encounter: Payer: Self-pay | Admitting: Emergency Medicine

## 2023-09-06 ENCOUNTER — Other Ambulatory Visit: Payer: Self-pay

## 2023-09-06 ENCOUNTER — Emergency Department
Admission: EM | Admit: 2023-09-06 | Discharge: 2023-09-06 | Disposition: A | Discharge status: Home / Self Care | Payer: Medicaid Other | Attending: Emergency Medicine | Admitting: Emergency Medicine

## 2023-09-06 DIAGNOSIS — R0602 Shortness of breath: Secondary | ICD-10-CM | POA: Diagnosis present

## 2023-09-06 DIAGNOSIS — Z20822 Contact with and (suspected) exposure to covid-19: Secondary | ICD-10-CM | POA: Insufficient documentation

## 2023-09-06 DIAGNOSIS — J101 Influenza due to other identified influenza virus with other respiratory manifestations: Secondary | ICD-10-CM | POA: Insufficient documentation

## 2023-09-06 LAB — CBC WITH DIFFERENTIAL/PLATELET
Abs Immature Granulocytes: 0.01 10*3/uL (ref 0.00–0.07)
Basophils Absolute: 0 10*3/uL (ref 0.0–0.1)
Basophils Relative: 1 %
Eosinophils Absolute: 0 10*3/uL (ref 0.0–0.5)
Eosinophils Relative: 0 %
HCT: 38 % (ref 36.0–46.0)
Hemoglobin: 13.6 g/dL (ref 12.0–15.0)
Immature Granulocytes: 0 %
Lymphocytes Relative: 34 %
Lymphs Abs: 2.2 10*3/uL (ref 0.7–4.0)
MCH: 30.4 pg (ref 26.0–34.0)
MCHC: 35.8 g/dL (ref 30.0–36.0)
MCV: 84.8 fL (ref 80.0–100.0)
Monocytes Absolute: 0.3 10*3/uL (ref 0.1–1.0)
Monocytes Relative: 5 %
Neutro Abs: 3.7 10*3/uL (ref 1.7–7.7)
Neutrophils Relative %: 60 %
Platelets: 302 10*3/uL (ref 150–400)
RBC: 4.48 MIL/uL (ref 3.87–5.11)
RDW: 12.3 % (ref 11.5–15.5)
WBC: 6.3 10*3/uL (ref 4.0–10.5)
nRBC: 0 % (ref 0.0–0.2)

## 2023-09-06 LAB — RESP PANEL BY RT-PCR (RSV, FLU A&B, COVID)  RVPGX2
Influenza A by PCR: POSITIVE — AB
Influenza B by PCR: NEGATIVE
Resp Syncytial Virus by PCR: NEGATIVE
SARS Coronavirus 2 by RT PCR: NEGATIVE

## 2023-09-06 LAB — BASIC METABOLIC PANEL
Anion gap: 11 (ref 5–15)
BUN: 9 mg/dL (ref 6–20)
CO2: 24 mmol/L (ref 22–32)
Calcium: 8.3 mg/dL — ABNORMAL LOW (ref 8.9–10.3)
Chloride: 101 mmol/L (ref 98–111)
Creatinine, Ser: 0.47 mg/dL (ref 0.44–1.00)
GFR, Estimated: >60 mL/min (ref >60)
Glucose, Bld: 83 mg/dL (ref 70–99)
Potassium: 3.6 mmol/L (ref 3.5–5.1)
Sodium: 136 mmol/L (ref 135–145)

## 2023-09-06 LAB — TROPONIN I (HIGH SENSITIVITY): Troponin I (High Sensitivity): 3 ng/L (ref <18)

## 2023-09-06 MED ORDER — PSEUDOEPH-BROMPHEN-DM 30-2-10 MG/5ML PO SYRP: 5.0000 mL | ORAL_SOLUTION | Freq: Four times a day (QID) | ORAL | 0 refills | Status: DC | PRN

## 2023-09-06 NOTE — Discharge Instructions (Addendum)
 You have been diagnosed with influenza A, shortness of breath.  Please take cough syrup 5 mL by mouth 4 times daily as needed.  Please drink plenty fluids.  Decrease intake of caffeine.  Make an appointment with your PCP if you have new symptoms or symptoms worsen.

## 2023-09-06 NOTE — ED Triage Notes (Signed)
 Pt says she has been having panic attacks since last night. Pt says everyone in the house has been diagnosed with the flu. She says since yesterday she feels "weird and short of breath." Pt says she has been fever free x24 hours. Pt reports she had an episode where she was sitting at [facility] doing nothing and she started breathing really hard and had pain across her chest.

## 2023-09-06 NOTE — ED Notes (Signed)
 See triage note  Presents with some SOB  States she has been exposed to the flu  Afebrile   Thinks she may have had a panic attack

## 2023-09-06 NOTE — ED Triage Notes (Signed)
 Firs tnurse note: Pt to ED via POV from [facility] Pt reports dizziness and SOB. Flu has been going around in house. Pt reports feeling like she was also having a panic attack at [facility] brought over for further evaluation.

## 2023-09-06 NOTE — ED Provider Notes (Signed)
 [facility] Provider Note    Event Date/Time   First MD Initiated Contact with Patient 09/06/23 1842     (approximate)   History   Shortness of Breath   HPI  Kendra Santos is a 33 y.o. female patient presents today with history of panic attack in the last 24 hours.  Patient states shortness of breath.  Patient states children were diagnosed with influenza A.  Patient has history of 5 days of cough fever and diarrhea, symptoms that improve with yesterday.       Physical Exam   Triage Vital Signs: ED Triage Vitals  Encounter Vitals Group     BP 09/06/23 1645 132/83     Systolic BP Percentile --      Diastolic BP Percentile --      Pulse Rate 09/06/23 1645 61     Resp 09/06/23 1645 18     Temp 09/06/23 1645 97.7 F (36.5 C)     Temp Source 09/06/23 1645 Oral     SpO2 09/06/23 1645 100 %     Weight 09/06/23 1651 190 lb (86.2 kg)     Height 09/06/23 1651 5\' 3"  (1.6 m)     Head Circumference --      Peak Flow --      Pain Score 09/06/23 1651 2     Pain Loc --      Pain Education --      Exclude from Growth Chart --     Most recent vital signs: Vitals:   09/06/23 1645 09/06/23 1648  BP: 132/83   Pulse: 61 61  Resp: 18   Temp: 97.7 F (36.5 C) 97.7 F (36.5 C)  SpO2: 100%      Constitutional: Alert  Eyes: Conjunctivae are normal.  Head: Atraumatic. Nose: No congestion/rhinnorhea. Mouth/Throat: Mucous membranes are moist.   Neck: Painless ROM.  Cardiovascular:   Good peripheral circulation. Respiratory: Normal respiratory effort.  No retractions.  No wheezing Gastrointestinal: Soft and nontender.  Musculoskeletal:  no deformity Neurologic:  MAE spontaneously. No gross focal neurologic deficits are appreciated.  Skin:  Skin is warm, dry and intact. No rash noted. Psychiatric: Mood and affect are normal. Speech and behavior are normal.    ED Results / Procedures / Treatments   Labs (all labs ordered are listed, but only  abnormal results are displayed) Labs Reviewed  RESP PANEL BY RT-PCR (RSV, FLU A&B, COVID)  RVPGX2 - Abnormal; Notable for the following components:      Result Value   Influenza A by PCR POSITIVE (*)    All other components within normal limits  BASIC METABOLIC PANEL - Abnormal; Notable for the following components:   Calcium 8.3 (*)    All other components within normal limits  CBC WITH DIFFERENTIAL/PLATELET  TROPONIN I (HIGH SENSITIVITY)  TROPONIN I (HIGH SENSITIVITY)     EKG See physician read    RADIOLOGY I independently reviewed and interpreted imaging and agree with radiologists findings.      PROCEDURES:  Critical Care performed:   Procedures   MEDICATIONS ORDERED IN ED: Medications - No data to display   IMPRESSION / MDM / ASSESSMENT AND PLAN / ED COURSE  I reviewed the triage vital signs and the nursing notes.  Differential diagnosis includes, but is not limited to, influenza, panic attack, COVID, pneumonia  Patient's presentation is most consistent with acute complicated illness / injury requiring diagnostic workup.   Patient's diagnosis is consistent with influenza A,  anxiety. I independently reviewed and interpreted imaging and agree with radiologists findings.  Physical exam x-ray rule out pneumonia. Labs are rea reassuring. I did review the patient's allergies and medications. Patient will be discharged home with prescriptions for   . Patient is to follow up with PCP as needed or otherwise directed. Patient is given ED precautions to return to the ED for any worsening or new symptoms. Discussed plan of care with patient, answered all of patient's questions, Patient agreeable to plan of care. Advised patient to take medications according to the instructions on the label. Discussed possible side effects of new medications. Patient verbalized understanding. Clinical Course as of 09/06/23 2034  Mon Sep 06, 2023  2029 DG Chest 2 View Negative radiographs of  the chest. [AE]  2029 EKG 12-Lead Normal sinus rhythm [AE]    Clinical Course User Index [AE] Awilda Lennox, PA-C     FINAL CLINICAL IMPRESSION(S) / ED DIAGNOSES   Final diagnoses:  Influenza A  Shortness of breath     Rx / DC Orders   ED Discharge Orders          Ordered    brompheniramine-pseudoephedrine-DM 30-2-10 MG/5ML syrup  4 times daily PRN        09/06/23 2033             Note:  This document was prepared using Dragon voice recognition software and may include unintentional dictation errors.   Awilda Lennox, PA-C 09/06/23 2039    Bradler, Evan K, MD 09/06/23 2112

## 2023-09-06 NOTE — ED Notes (Signed)
 Lab called to send technician to collect blood work.

## 2023-09-06 NOTE — ED Provider Triage Note (Signed)
 Emergency Medicine Provider Triage Evaluation Note  Kendra Santos , a 33 y.o. female  was evaluated in triage.  Pt complains of SOB. She reports having lots of panic attacks. She believes she has the flu as her son tested positive, she has had fevers and SOB. She feels like she has been under a lot of stress. Does not take any anxiety medication. She describes having periods where she is breathing really hard and then has chest pain.   Review of Systems  Positive: SOB, cough, runny nose, fever Negative:   Physical Exam  BP 132/83 (BP Location: Left Arm)   Pulse 61   Temp 97.7 F (36.5 C) (Oral)   Resp 18   SpO2 100%  Gen:   Awake, no distress   Resp:  Normal effort  MSK:   Moves extremities without difficulty  Other:    Medical Decision Making  Medically screening exam initiated at 4:47 PM.  Appropriate orders placed.  Alfredia Ina Kathrene Parents was informed that the remainder of the evaluation will be completed by another provider, this initial triage assessment does not replace that evaluation, and the importance of remaining in the ED until their evaluation is complete.     Phyliss Breen, PA-C 09/06/23 1650

## 2024-01-12 ENCOUNTER — Emergency Department

## 2024-01-12 ENCOUNTER — Emergency Department
Admission: EM | Admit: 2024-01-12 | Discharge: 2024-01-12 | Disposition: A | Discharge status: Home / Self Care | Attending: Emergency Medicine | Admitting: Emergency Medicine

## 2024-01-12 ENCOUNTER — Other Ambulatory Visit: Payer: Self-pay

## 2024-01-12 DIAGNOSIS — Z3A12 12 weeks gestation of pregnancy: Secondary | ICD-10-CM | POA: Insufficient documentation

## 2024-01-12 DIAGNOSIS — O209 Hemorrhage in early pregnancy, unspecified: Secondary | ICD-10-CM | POA: Insufficient documentation

## 2024-01-12 DIAGNOSIS — O4690 Antepartum hemorrhage, unspecified, unspecified trimester: Secondary | ICD-10-CM

## 2024-01-12 LAB — HCG, QUANTITATIVE, PREGNANCY: hCG, Beta Chain, Quant, S: 90237 m[IU]/mL — ABNORMAL HIGH (ref <5)

## 2024-01-12 LAB — URINALYSIS, ROUTINE W REFLEX MICROSCOPIC
Bacteria, UA: NONE SEEN
Bilirubin Urine: NEGATIVE
Glucose, UA: NEGATIVE mg/dL
Ketones, ur: 80 mg/dL — AB
Leukocytes,Ua: NEGATIVE
Nitrite: NEGATIVE
Protein, ur: NEGATIVE mg/dL
Specific Gravity, Urine: 1.018 (ref 1.005–1.030)
pH: 7 (ref 5.0–8.0)

## 2024-01-12 LAB — BASIC METABOLIC PANEL WITH GFR
Anion gap: 7 (ref 5–15)
BUN: 6 mg/dL (ref 6–20)
CO2: 22 mmol/L (ref 22–32)
Calcium: 8.5 mg/dL — ABNORMAL LOW (ref 8.9–10.3)
Chloride: 105 mmol/L (ref 98–111)
Creatinine, Ser: 0.4 mg/dL — ABNORMAL LOW (ref 0.44–1.00)
GFR, Estimated: >60 mL/min (ref >60)
Glucose, Bld: 90 mg/dL (ref 70–99)
Potassium: 3.7 mmol/L (ref 3.5–5.1)
Sodium: 134 mmol/L — ABNORMAL LOW (ref 135–145)

## 2024-01-12 LAB — CBC
HCT: 34.8 % — ABNORMAL LOW (ref 36.0–46.0)
Hemoglobin: 12 g/dL (ref 12.0–15.0)
MCH: 30.8 pg (ref 26.0–34.0)
MCHC: 34.5 g/dL (ref 30.0–36.0)
MCV: 89.2 fL (ref 80.0–100.0)
Platelets: 313 10*3/uL (ref 150–400)
RBC: 3.9 MIL/uL (ref 3.87–5.11)
RDW: 12.4 % (ref 11.5–15.5)
WBC: 6.7 10*3/uL (ref 4.0–10.5)
nRBC: 0 % (ref 0.0–0.2)

## 2024-01-12 LAB — ABO/RH: ABO/RH(D): O POS

## 2024-01-12 NOTE — ED Triage Notes (Signed)
 Pt here with vaginal bleeding that started this morning. Pt states the blood had clots in it. Pt is currently [redacted] weeks pregnant. Pt states she also saw mucus in the blood. Pt denies pain, NVD.

## 2024-01-12 NOTE — ED Provider Notes (Signed)
 [facility] Provider Note    Event Date/Time   First MD Initiated Contact with Patient 01/12/24 1311     (approximate)   History   Vaginal Bleeding   HPI  Kendra Santos is a 32 y.o. female G3P2 currently estimated to be [redacted] weeks pregnant who presents today for evaluation of vaginal bleeding.  Patient reports that she noticed mucousy urine.  Yesterday and today she had blood in her urine which she realized was coming from her vagina after she wiped the area.  She reports that she passed 1 clot with signs of a fingernail.  No abdominal pain or cramping.  No fevers or chills.  No back pain. Reports that she had an  ultrasound with this pregnancy already, confirming IUP.  Patient Active Problem List   Diagnosis Date Noted   Acute pancreatitis 04/11/2019   Indication for care in labor and delivery, antepartum 08/08/2018   Left breast abscess           Physical Exam   Triage Vital Signs: ED Triage Vitals  Encounter Vitals Group     BP 01/12/24 1242 127/75     Girls Systolic BP Percentile --      Girls Diastolic BP Percentile --      Boys Systolic BP Percentile --      Boys Diastolic BP Percentile --      Pulse Rate 01/12/24 1242 73     Resp 01/12/24 1242 17     Temp 01/12/24 1242 98.6 F (37 C)     Temp src --      SpO2 01/12/24 1242 96 %     Weight 01/12/24 1256 190 lb 0.6 oz (86.2 kg)     Height 01/12/24 1256 5' 3 (1.6 m)     Head Circumference --      Peak Flow --      Pain Score 01/12/24 1256 0     Pain Loc --      Pain Education --      Exclude from Growth Chart --     Most recent vital signs: Vitals:   01/12/24 1242  BP: 127/75  Pulse: 73  Resp: 17  Temp: 98.6 F (37 C)  SpO2: 96%    Physical Exam Vitals and nursing note reviewed.  Constitutional:      General: Awake and alert. No acute distress.    Appearance: Normal appearance. The patient is normal weight.  HENT:     Head: Normocephalic and atraumatic.      Mouth: Mucous membranes are moist.  Eyes:     General: PERRL. Normal EOMs        Right eye: No discharge.        Left eye: No discharge.     Conjunctiva/sclera: Conjunctivae normal.  Cardiovascular:     Rate and Rhythm: Normal rate and regular rhythm.     Pulses: Normal pulses.  Pulmonary:     Effort: Pulmonary effort is normal. No respiratory distress.     Breath sounds: Normal breath sounds.  Abdominal:     Abdomen is soft. There is no abdominal tenderness. No rebound or guarding. No distention. Musculoskeletal:        General: No swelling. Normal range of motion.     Cervical back: Normal range of motion and neck supple.  Skin:    General: Skin is warm and dry.     Capillary Refill: Capillary refill takes less than 2 seconds.  Findings: No rash.  Neurological:     Mental Status: The patient is awake and alert.      ED Results / Procedures / Treatments   Labs (all labs ordered are listed, but only abnormal results are displayed) Labs Reviewed  CBC - Abnormal; Notable for the following components:      Result Value   HCT 34.8 (*)    All other components within normal limits  BASIC METABOLIC PANEL WITH GFR - Abnormal; Notable for the following components:   Sodium 134 (*)    Creatinine, Ser 0.40 (*)    Calcium 8.5 (*)    All other components within normal limits  HCG, QUANTITATIVE, PREGNANCY - Abnormal; Notable for the following components:   hCG, Beta Chain, Quant, S 90,237 (*)    All other components within normal limits  URINALYSIS, ROUTINE W REFLEX MICROSCOPIC - Abnormal; Notable for the following components:   Color, Urine YELLOW (*)    APPearance CLEAR (*)    Hgb urine dipstick SMALL (*)    Ketones, ur 80 (*)    All other components within normal limits  POC URINE PREG, ED  ABO/RH     EKG     RADIOLOGY     PROCEDURES:  Critical Care performed:   Procedures   MEDICATIONS ORDERED IN ED: Medications - No data to display   IMPRESSION /  MDM / ASSESSMENT AND PLAN / ED COURSE  I reviewed the triage vital signs and the nursing notes.   Differential diagnosis includes, but is not limited to, subchorionic hemorrhage, spontaneous abortion, inevitable abortion, placental abnormality.  Patient is awake and alert, hemodynamically stable and afebrile.  She is nontoxic in appearance.  Further workup is indicated.  Labs were obtained in triage and I have added an ABO/Rh.  H&H is stable, hCG is elevated as expected.  Rh+, no need for RhoGAM.  Ultrasound also obtained.  Patient passed off to oncoming provider L. Dougherty, PA-C pending US  result and final disposition.   Patient's presentation is most consistent with acute complicated illness / injury requiring diagnostic workup.     FINAL CLINICAL IMPRESSION(S) / ED DIAGNOSES   Final diagnoses:  Vaginal bleeding in pregnancy     Rx / DC Orders   ED Discharge Orders     None        Note:  This document was prepared using Dragon voice recognition software and may include unintentional dictation errors.   Pinchas Reither E, PA-C 01/12/24 1509    Kandee Orion, MD 01/12/24 [phone]

## 2024-01-12 NOTE — ED Provider Notes (Signed)
 ----------------------------------------- 3:08 PM on 01/12/2024 -----------------------------------------  Blood pressure 127/75, pulse 73, temperature 98.6 F (37 C), resp. rate 17, height 5' 3 (1.6 m), weight 86.2 kg, last menstrual period 10/19/2023, SpO2 96%.  Assuming care from Jenna Poggi, PA-C.  In short, Kendra Santos is a 33 y.o. female with a chief complaint of Vaginal Bleeding .  Refer to the original H&P for additional details.  The current plan of care is to wait for ultrasound results and disposition accordingly, will advise patient to follow up with her OBGYN.  ____________________________________________    ED Results / Procedures / Treatments   Labs (all labs ordered are listed, but only abnormal results are displayed) Labs Reviewed  CBC - Abnormal; Notable for the following components:      Result Value   HCT 34.8 (*)    All other components within normal limits  BASIC METABOLIC PANEL WITH GFR - Abnormal; Notable for the following components:   Sodium 134 (*)    Creatinine, Ser 0.40 (*)    Calcium 8.5 (*)    All other components within normal limits  HCG, QUANTITATIVE, PREGNANCY - Abnormal; Notable for the following components:   hCG, Beta Chain, Quant, S 90,237 (*)    All other components within normal limits  URINALYSIS, ROUTINE W REFLEX MICROSCOPIC - Abnormal; Notable for the following components:   Color, Urine YELLOW (*)    APPearance CLEAR (*)    Hgb urine dipstick SMALL (*)    Ketones, ur 80 (*)    All other components within normal limits  POC URINE PREG, ED  ABO/RH    RADIOLOGY  I personally viewed and evaluated these images as part of my medical decision making, as well as reviewing the written report by the radiologist.  ED Provider Interpretation: Pregnancy estimated gestational age of [redacted] weeks and 6 days with fetal heart rate of 160 bpm.  No subchorionic hemorrhage.  US  OB LESS THAN 14 WEEKS WITH OB TRANSVAGINAL Result Date:  01/12/2024 CLINICAL DATA:  97620 Bleeding 16109 EXAM: OBSTETRIC <14 WK ULTRASOUND TECHNIQUE: Transabdominal ultrasound was performed for evaluation of the gestation as well as the maternal uterus and adnexal regions. COMPARISON:  None available for this pregnancy. FINDINGS: Intrauterine gestational sac: Single Yolk sac:  Not visualized, at this time. Fetal Pole:  Present Cardiac Activity: Present Heart Rate: 160 bpm CRL:  52 mm 11 w 6 d Subchorionic hemorrhage:  None visualized. Maternal uterus/adnexae: Both ovaries were suboptimally evaluated due to technique and high ovarian positioning. Otherwise, no concerning adnexal findings visualized. No free pelvic fluid. IMPRESSION: Single, live intrauterine gestation with an estimated gestational age of [redacted] weeks, 6 days. Fetal heart rate of 160 beats per minute. Continued close obstetric and sonographic follow-up is recommended. Electronically Signed   By: Rance Burrows M.D.   On: 01/12/2024 16:11     PROCEDURES:  Critical Care performed: No  Procedures   MEDICATIONS ORDERED IN ED: Medications - No data to display   IMPRESSION / MDM / ASSESSMENT AND PLAN / ED COURSE  I reviewed the triage vital signs and the nursing notes.                              Differential diagnosis includes, but is not limited to, bleeding in pregnancy, subchorionic hematoma, threatened miscarriage.  Patient's presentation is most consistent with acute complicated illness / injury requiring diagnostic workup.  Patient's diagnosis is consistent with vaginal bleeding  in pregnancy. Patient will be discharged home. Patient is to follow up with OBGYN as needed or otherwise directed. Patient is given ED precautions to return to the ED for any worsening or new symptoms.     FINAL CLINICAL IMPRESSION(S) / ED DIAGNOSES   Final diagnoses:  Vaginal bleeding in pregnancy     Rx / DC Orders   ED Discharge Orders     None        Note:  This document was prepared  using Dragon voice recognition software and may include unintentional dictation errors.    Phyliss Breen, PA-C 01/12/24 1646    Lubertha Rush, MD 01/12/24 [phone]

## 2024-01-12 NOTE — Discharge Instructions (Signed)
 Your ultrasound showed a healthy pregnancy. Please follow up with your OBGYN.

## 2024-01-12 NOTE — ED Notes (Signed)
 Pt verbalizes understanding of discharge instructions. Opportunity for questioning and answers were provided. Pt discharged from ED to home with family.

## 2024-01-20 LAB — OB RESULTS CONSOLE HEPATITIS B SURFACE ANTIGEN: Hepatitis B Surface Ag: NEGATIVE

## 2024-01-20 LAB — OB RESULTS CONSOLE VARICELLA ZOSTER ANTIBODY, IGG: Varicella: IMMUNE

## 2024-01-20 LAB — OB RESULTS CONSOLE RUBELLA ANTIBODY, IGM: Rubella: IMMUNE

## 2024-04-18 ENCOUNTER — Observation Stay
Admission: EM | Admit: 2024-04-18 | Discharge: 2024-04-18 | Disposition: A | Discharge status: Home / Self Care | Attending: Certified Nurse Midwife | Admitting: Certified Nurse Midwife

## 2024-04-18 ENCOUNTER — Other Ambulatory Visit: Payer: Self-pay

## 2024-04-18 DIAGNOSIS — F32A Depression, unspecified: Secondary | ICD-10-CM | POA: Insufficient documentation

## 2024-04-18 DIAGNOSIS — Z7982 Long term (current) use of aspirin: Secondary | ICD-10-CM | POA: Diagnosis not present

## 2024-04-18 DIAGNOSIS — O99342 Other mental disorders complicating pregnancy, second trimester: Secondary | ICD-10-CM | POA: Diagnosis not present

## 2024-04-18 DIAGNOSIS — O0992 Supervision of high risk pregnancy, unspecified, second trimester: Principal | ICD-10-CM | POA: Insufficient documentation

## 2024-04-18 DIAGNOSIS — Z3A26 26 weeks gestation of pregnancy: Secondary | ICD-10-CM | POA: Diagnosis not present

## 2024-04-18 DIAGNOSIS — O368190 Decreased fetal movements, unspecified trimester, not applicable or unspecified: Principal | ICD-10-CM | POA: Diagnosis present

## 2024-04-18 DIAGNOSIS — O368120 Decreased fetal movements, second trimester, not applicable or unspecified: Secondary | ICD-10-CM | POA: Diagnosis not present

## 2024-04-18 MED ORDER — DOCUSATE SODIUM 100 MG PO CAPS: 100.0000 mg | ORAL_CAPSULE | Freq: Every day | ORAL | Status: DC

## 2024-04-18 MED ORDER — PRENATAL MULTIVITAMIN CH
1.0000 | ORAL_TABLET | Freq: Every day | ORAL | Status: DC
  Filled 2024-04-18: qty 1

## 2024-04-18 MED ORDER — ACETAMINOPHEN 325 MG PO TABS: 650.0000 mg | ORAL_TABLET | ORAL | Status: DC | PRN

## 2024-04-18 NOTE — OB Triage Note (Signed)

## 2024-04-18 NOTE — OB Triage Note (Signed)
 Patient reports to L&D with complaints of decreased fetal movement since yesterday. Patient denies leaking of fluid, vaginal bleeding or contractions at this time.

## 2024-04-18 NOTE — Discharge Summary (Signed)
 Kendra Santos is a 33 y.o. female. She is at [redacted]w[redacted]d gestation. Patient's last menstrual period was 10/19/2023 (exact date). Estimated Date of Delivery: 07/25/24  Prenatal care site: [facility]   Current pregnancy complicated by:  - placenta previa - obesity - depression - history of pre-eclampsia with severe features  Chief complaint: decreased fetal movement  She reports decreased fetal movement since yesterday. Upon arrival to L&D she reports the movement has become normal again.  S: Resting comfortably. no CTX, no VB.no LOF,  Active fetal movement.  Denies: HA, visual changes, SOB, or RUQ/epigastric pain  Maternal Medical History:   Past Medical History:  Diagnosis Date   Breast abscess    Hypertension     Past Surgical History:  Procedure Laterality Date   CHOLECYSTECTOMY N/A 04/14/2019   Procedure: LAPAROSCOPIC CHOLECYSTECTOMY;  Surgeon: Marolyn Nest, MD;  Location: [facility] ORS;  Service: General;  Laterality: N/A;   sebaceous cyst excision  2015   Back    No Known Allergies  Prior to Admission medications   Medication Sig Start Date End Date Taking? Authorizing Provider  aspirin EC 81 MG tablet Take 81 mg by mouth daily. Swallow whole.   Yes [provider]  Prenatal Vit-Fe Fumarate-FA (MULTIVITAMIN-PRENATAL) 27-0.8 MG TABS tablet Take 1 tablet by mouth daily at 12 noon.   Yes [provider]  brompheniramine-pseudoephedrine-DM 30-2-10 MG/5ML syrup Take 5 mLs by mouth 4 (four) times daily as needed. Patient not taking: Reported on 04/18/2024 09/06/23   Janit Kast, PA-C    Social History: She  reports that she has never smoked. She has never been exposed to tobacco smoke. She has never used smokeless tobacco. She reports that she does not currently use alcohol. She reports that she does not use drugs.  Family History: family history includes Asthma in her brother; Diabetes in her mother; Hypertension in her mother.  no history  of gyn cancers  Review of Systems: A full review of systems was performed and negative except as noted in the HPI.     O:  Temp 98.8 F (37.1 C) (Oral)   Resp 16   Ht 5' 1 (1.549 m)   Wt 84.8 kg   LMP 10/19/2023 (Exact Date)   BMI 35.33 kg/m  No results found for this or any previous visit (from the past 48 hours).   Constitutional: NAD, AAOx3  HE/ENT: extraocular movements grossly intact, moist mucous membranes CV: RRR PULM: nl respiratory effort, CTABL     Abd: gravid, non-tender, non-distended, soft      Ext: Non-tender, Nonedematous   Psych: mood appropriate, speech normal Pelvic: deferred  Fetal  monitoring: Cat 1 Appropriate for GA Baseline: 145bpm Variability: moderate Accelerations: present x >2 Decelerations absent Time 2 hours  A/P: 33 y.o. [redacted]w[redacted]d here for antenatal surveillance for decreased fetal movement  Principle Diagnosis:  High risk pregnancy in second trimester  Labor: not present.  Fetal Wellbeing: Reassuring Cat 1 tracing. Reactive NST  D/c home stable, precautions reviewed, follow-up as scheduled.    [name], CNM 04/18/2024 2:39 PM

## 2024-05-02 LAB — OB RESULTS CONSOLE HIV ANTIBODY (ROUTINE TESTING): HIV: NONREACTIVE

## 2024-06-30 LAB — OB RESULTS CONSOLE GBS: GBS: NEGATIVE

## 2024-06-30 NOTE — Progress Notes (Signed)
 Obstetrics & Gynecology Office Visit  Subjective  Kendra Santos is a 33 y.o. [phone] at [redacted]w[redacted]d being seen today for ongoing prenatal care.  She is currently monitored for tthis high-risk pregnancy.  Patient's last menstrual period was 10/19/2023 (approximate). Estimated Date of Delivery: 07/25/24  History of Present Illness Kendra Santos is a 33 year old female who presents with decreased fetal movement.  She has noticed a decrease in fetal movement since last night. Despite performing kick counts and noting eight movements, she feels the baby is not moving as much as usual. Her abdomen was hard for a prolonged period, which has since softened slightly, but the movements remain less frequent.  She experiences 'little pains' radiating from the front to the back, though they are not severe or long-lasting enough to hinder her mobility. Additionally, she reports hip pain when sitting for extended periods and then attempting to stand.    Pt denies contractions, vaginal bleeding, leaking fluid. Reports decreased fetal movement since last night. Pt denies HA, VD or RUQ pain.   Objective  BP 118/77   Pulse 76   Ht 157.5 cm (5' 2)   Wt 84.4 kg (186 lb)   LMP 10/19/2023 (Approximate)   BMI 34.02 kg/m    Fetal Status: Fetal Heart Rate: 150 bpm Fundal Height (cm): 37 cm Movement: (!) Decreased  Presentation: Cephalic Pre-pregnant weight: 15.1 kg (187 lb)  TWG: -0.454 kg (-1 lb)  PE Chaperone note: A chaperone was included for sensitive portions of the exam- staff member name: Glenys Alstrom, CMA  Chaperone present for pelvic exam.  Gen: NAD  Pulm: No use of accessory muscles, normal respirations Abdomen: Gravid, nontender Ext : No edema, no rashes.   Psych: Mood, insight, judgement intact SVE: 1/50/+1/post/soft Fundal height: S=D  US : 06/29/2024 EFW: 3186 grams, 7 lbs0 oz, 79% --AC >99 % tile  AFI: 16.0 cm, MVP 5.3 cm FHT: 146 bpm Presentation: cephalic Placenta:  fundal BPP: 8/8  NST: Baseline FHR: 140 beats/min Variability: moderate Accelerations: present Decelerations: absent Tocometry: N/A Time: at least 20 minutes   Interpretation: Category I INDICATIONS: decreased fetal movement RESULTS:  A NST procedure was performed with FHR monitoring and a normal baseline established, appropriate time of 20-40 minutes of evaluation, and accels >2 seen w 15x15 characteristics.  Results show a REACTIVE NST.   Assessment   33 y.o. [id number] at [redacted]w[redacted]d by  07/25/2024, by Last Menstrual Period presenting for routine prenatal visit  The primary encounter diagnosis was Supervision of high risk pregnancy in third trimester (HHS-HCC). Diagnoses of History of preterm premature rupture of membranes (PROM) in previous pregnancy, currently pregnant in second trimester (HHS-HCC) and Decreased fetal movements in third trimester, single or unspecified fetus (HHS-HCC) were also pertinent to this visit.   Plan   Problem list reviewed and/or updated   Assessment & Plan -HSV prophylaxis not indicated -GBS CT/GC collected.   -Planning husband, mother, and sister for labor support;  -Labor plans reviewed.  Desires IVPM -Feeding preferences reviewed. Desires breast -Discussed pediatrician options, handout provided .   -RSV vaccine given  Kick counts reviewed with the patient in detail.  Patient instructed to assess for fetal activity daily at regular intervals.  Counts to be done if decreased activity noted.  Patient instructed to contact the office if counts do not reveal adequate movements. Labor precautions (ROM, vaginal bleeding, s/s labor) reviewed.    High-Risk Prenatal Care [redacted] weeks pregnant with history of preterm birth. Cervix 1 cm dilated, 50%  effaced. Fetal heart rate 150 bpm. Prefers unmedicated delivery with IV medication as needed. - Conducted group B strep swab. - Checked cervix for dilation and effacement. - Monitor for contractions, water breaking, or  significant changes in fetal movement. - Provided list of pediatricians for postnatal care. - Discussed pain management options. - Scheduled follow-up in one week.  Decreased Fetal Movement Reports decreased fetal movement. Recent ultrasound for BPP normal  - NST done today and was reactive  - Monitor fetal movements and report significant changes. - Educated on normal fetal movement patterns and signs of concern. - Advised to seek immediate care if contractions occur every 7 minutes or less for an hour, if water breaks, or if fetal movement decreases significantly.    Orders Placed This Encounter  Procedures   Strep Gp B NAA  - [facility]    Release to patient:   Immediate   Xpert CT/NG, PCR - [facility]    Release to patient:   Immediate   CBC w/auto Differential (5 Part)    Release to patient:   Immediate     Return in about 1 week (around 07/07/2024) for routine PNC.   Attestation Statement:   I personally performed the service, non-incident to. (WP)   ANNA MICHELLE MACKIE, CNM   This note has been created using automated tools and reviewed for accuracy by [facility].

## 2024-07-05 ENCOUNTER — Observation Stay
Admission: EM | Admit: 2024-07-05 | Discharge: 2024-07-05 | Disposition: A | Discharge status: Home / Self Care | Attending: Certified Nurse Midwife | Admitting: Obstetrics and Gynecology

## 2024-07-05 ENCOUNTER — Encounter: Payer: Self-pay | Admitting: Obstetrics and Gynecology

## 2024-07-05 DIAGNOSIS — R3915 Urgency of urination: Secondary | ICD-10-CM | POA: Diagnosis not present

## 2024-07-05 DIAGNOSIS — Z3A37 37 weeks gestation of pregnancy: Secondary | ICD-10-CM | POA: Insufficient documentation

## 2024-07-05 DIAGNOSIS — R102 Pelvic and perineal pain unspecified side: Secondary | ICD-10-CM | POA: Diagnosis not present

## 2024-07-05 DIAGNOSIS — R399 Unspecified symptoms and signs involving the genitourinary system: Secondary | ICD-10-CM | POA: Diagnosis present

## 2024-07-05 DIAGNOSIS — O26893 Other specified pregnancy related conditions, third trimester: Principal | ICD-10-CM | POA: Insufficient documentation

## 2024-07-05 LAB — URINALYSIS, ROUTINE W REFLEX MICROSCOPIC
Bilirubin Urine: NEGATIVE
Glucose, UA: NEGATIVE mg/dL
Hgb urine dipstick: NEGATIVE
Ketones, ur: NEGATIVE mg/dL
Leukocytes,Ua: NEGATIVE
Nitrite: NEGATIVE
Protein, ur: NEGATIVE mg/dL
Specific Gravity, Urine: 1.018 (ref 1.005–1.030)
pH: 7 (ref 5.0–8.0)

## 2024-07-05 MED ORDER — ACETAMINOPHEN 325 MG PO TABS: 650.0000 mg | ORAL_TABLET | ORAL | Status: DC | PRN

## 2024-07-05 MED ORDER — CALCIUM CARBONATE ANTACID 500 MG PO CHEW: 2.0000 | CHEWABLE_TABLET | ORAL | Status: DC | PRN

## 2024-07-05 NOTE — OB Triage Note (Signed)
 Kendra Santos 33 y.o. @G3P2  @37w1dGA   presents to Labor & Delivery triage via wheelchair steered by ED staff reporting abdominal cramping that started yesterday morning and difficulty urinating. She rates her pain a 4 out of10 She states that she feels like she needs to pee a lot but not much comes out and is feeling pressure with voiding. She denies signs and symptoms consistent with rupture of membranes or active vaginal bleeding. She denies contractions and states positive fetal movement. External FM and TOCO applied to non-tender abdomen. Initial FHR 141. Vital signs obtained and within normal limits. Patient oriented to care environment including call bell and bed control use. Edsel Blush, CNM notified of patient's arrival.

## 2024-07-07 NOTE — Discharge Summary (Addendum)
 Kendra Santos is a 33 y.o. female. She is at [redacted]w[redacted]d gestation. Patient's last menstrual period was 10/19/2023 (exact date). Estimated Date of Delivery: 07/25/24  Prenatal care site: [facility]   Current pregnancy complicated by:  - obesity - placenta previa (resolved) - history of preterm labor - history of pre-eclampsia with severe features - depression  Chief complaint: pelvic pressure, cramping, and difficulty urinating  She reports frequent urination of small amounts, pressure in her pelvis when voiding, and occasional cramping. She denies contractions, bleeding, or leaking fluid. She endorses good fetal movement.  S: Resting comfortably. no CTX, no VB.no LOF,  Active fetal movement.  Denies: HA, visual changes, SOB, or RUQ/epigastric pain  Maternal Medical History:   Past Medical History:  Diagnosis Date   Breast abscess    Hypertension     Past Surgical History:  Procedure Laterality Date   CHOLECYSTECTOMY N/A 04/14/2019   Procedure: LAPAROSCOPIC CHOLECYSTECTOMY;  Surgeon: Marolyn Nest, MD;  Location: [facility] ORS;  Service: General;  Laterality: N/A;   sebaceous cyst excision  2015   Back    Allergies[1]  Prior to Admission medications  Medication Sig Start Date End Date Taking? Authorizing Provider  aspirin EC 81 MG tablet Take 81 mg by mouth daily. Swallow whole.   Yes [provider]  Prenatal Vit-Fe Fumarate-FA (PRENATAL VITAMINS) 28-0.8 MG TABS Take 1 tablet by mouth daily. 06/20/24  Yes [provider]  brompheniramine-pseudoephedrine-DM 30-2-10 MG/5ML syrup Take 5 mLs by mouth 4 (four) times daily as needed. Patient not taking: Reported on 04/18/2024 09/06/23   Janit Kast, PA-C  Prenatal Vit-Fe Fumarate-FA (MULTIVITAMIN-PRENATAL) 27-0.8 MG TABS tablet Take 1 tablet by mouth daily at 12 noon.    [provider]     Social History: She  reports that she has never smoked. She has never been exposed to tobacco  smoke. She has never used smokeless tobacco. She reports that she does not currently use alcohol. She reports that she does not use drugs.  Family History: family history includes Asthma in her brother; Diabetes in her mother; Hypertension in her mother.  no history of gyn cancers  Review of Systems: A full review of systems was performed and negative except as noted in the HPI.     O:  BP 119/77 (BP Location: Left Arm)   Pulse 68   Temp 97.9 F (36.6 C) (Oral)   Resp 16   LMP 10/19/2023 (Exact Date)  Results for orders placed or performed during the hospital encounter of 07/05/24 (from the past 48 hours)  Urinalysis, Routine w reflex microscopic -Urine, Clean Catch   Collection Time: 07/05/24  8:10 PM  Result Value Ref Range   Color, Urine YELLOW (A) YELLOW   APPearance HAZY (A) CLEAR   Specific Gravity, Urine 1.018 1.005 - 1.030   pH 7.0 5.0 - 8.0   Glucose, UA NEGATIVE NEGATIVE mg/dL   Hgb urine dipstick NEGATIVE NEGATIVE   Bilirubin Urine NEGATIVE NEGATIVE   Ketones, ur NEGATIVE NEGATIVE mg/dL   Protein, ur NEGATIVE NEGATIVE mg/dL   Nitrite NEGATIVE NEGATIVE   Leukocytes,Ua NEGATIVE NEGATIVE     Constitutional: NAD, AAOx3  HE/ENT: extraocular movements grossly intact, moist mucous membranes CV: RRR PULM: nl respiratory effort, CTABL     Abd: gravid, non-tender, non-distended, soft      Ext: Non-tender, Nonedematous   Psych: mood appropriate, speech normal Pelvic: 1/50/-2  Pelvic exam: normal external genitalia, vulva, vagina, cervix, uterus and adnexa.  Fetal  monitoring: Cat  1 Appropriate for GA Baseline: 135bpm Variability: moderate Accelerations: present x >2 Decelerations absent Time  A/P: 33 y.o. [redacted]w[redacted]d here for antenatal surveillance for UTI symptoms, pelvic pressure and cramping  Principle Diagnosis:  high risk pregnancy, 3rd trimester  Labor: not present.  UTI: not present. Fetal Wellbeing: Reassuring Cat 1 tracing. D/c home stable, precautions  reviewed, follow-up as scheduled.    [name], CNM     [1] No Known Allergies

## 2024-07-12 ENCOUNTER — Encounter: Payer: Self-pay | Admitting: Obstetrics and Gynecology

## 2024-07-12 ENCOUNTER — Other Ambulatory Visit: Payer: Self-pay

## 2024-07-12 ENCOUNTER — Inpatient Hospital Stay: Admission: EM | Admit: 2024-07-12 | Source: Home / Self Care | Admitting: Obstetrics and Gynecology

## 2024-07-12 DIAGNOSIS — O26893 Other specified pregnancy related conditions, third trimester: Secondary | ICD-10-CM | POA: Diagnosis present

## 2024-07-12 DIAGNOSIS — Z7982 Long term (current) use of aspirin: Secondary | ICD-10-CM

## 2024-07-12 DIAGNOSIS — Z8249 Family history of ischemic heart disease and other diseases of the circulatory system: Secondary | ICD-10-CM | POA: Diagnosis not present

## 2024-07-12 DIAGNOSIS — O99214 Obesity complicating childbirth: Secondary | ICD-10-CM | POA: Diagnosis present

## 2024-07-12 DIAGNOSIS — Z833 Family history of diabetes mellitus: Secondary | ICD-10-CM | POA: Diagnosis not present

## 2024-07-12 DIAGNOSIS — Z3A38 38 weeks gestation of pregnancy: Secondary | ICD-10-CM

## 2024-07-12 DIAGNOSIS — O26853 Spotting complicating pregnancy, third trimester: Secondary | ICD-10-CM | POA: Diagnosis present

## 2024-07-12 LAB — CBC
HCT: 36.5 % (ref 36.0–46.0)
Hemoglobin: 12.9 g/dL (ref 12.0–15.0)
MCH: 31.3 pg (ref 26.0–34.0)
MCHC: 35.3 g/dL (ref 30.0–36.0)
MCV: 88.6 fL (ref 80.0–100.0)
Platelets: 281 K/uL (ref 150–400)
RBC: 4.12 MIL/uL (ref 3.87–5.11)
RDW: 13.6 % (ref 11.5–15.5)
WBC: 11.7 K/uL — ABNORMAL HIGH (ref 4.0–10.5)
nRBC: 0 % (ref 0.0–0.2)

## 2024-07-12 LAB — TYPE AND SCREEN
ABO/RH(D): O POS
Antibody Screen: NEGATIVE

## 2024-07-12 MED ORDER — FENTANYL CITRATE (PF) 100 MCG/2ML IJ SOLN
50.0000 ug | INTRAMUSCULAR | Status: DC | PRN
  Administered 2024-07-12: 23:00:00 100 ug via INTRAVENOUS
  Filled 2024-07-12: qty 2

## 2024-07-12 MED ORDER — ONDANSETRON HCL 4 MG/2ML IJ SOLN
4.0000 mg | Freq: Four times a day (QID) | INTRAMUSCULAR | Status: DC | PRN
  Administered 2024-07-13: 4 mg via INTRAVENOUS
  Filled 2024-07-12: qty 2

## 2024-07-12 MED ORDER — ACETAMINOPHEN 325 MG PO TABS: 650.0000 mg | ORAL_TABLET | ORAL | Status: DC | PRN

## 2024-07-12 MED ORDER — OXYCODONE-ACETAMINOPHEN 5-325 MG PO TABS: 2.0000 | ORAL_TABLET | ORAL | Status: DC | PRN

## 2024-07-12 MED ORDER — OXYTOCIN BOLUS FROM INFUSION
333.0000 mL | Freq: Once | INTRAVENOUS | Status: AC
  Administered 2024-07-13: 04:00:00 333 mL via INTRAVENOUS

## 2024-07-12 MED ORDER — LIDOCAINE HCL (PF) 1 % IJ SOLN: 30.0000 mL | INTRAMUSCULAR | Status: DC | PRN

## 2024-07-12 MED ORDER — OXYCODONE-ACETAMINOPHEN 5-325 MG PO TABS: 1.0000 | ORAL_TABLET | ORAL | Status: DC | PRN

## 2024-07-12 MED ORDER — AMMONIA AROMATIC IN INHA
RESPIRATORY_TRACT | Status: AC
  Filled 2024-07-12: qty 10

## 2024-07-12 MED ORDER — OXYTOCIN-SODIUM CHLORIDE 30-0.9 UT/500ML-% IV SOLN
2.5000 [IU]/h | INTRAVENOUS | Status: DC
  Filled 2024-07-12: qty 500

## 2024-07-12 MED ORDER — SOD CITRATE-CITRIC ACID 500-334 MG/5ML PO SOLN: 30.0000 mL | ORAL | Status: DC | PRN

## 2024-07-12 MED ORDER — LACTATED RINGERS IV SOLN: INTRAVENOUS | Status: DC

## 2024-07-12 MED ORDER — LACTATED RINGERS IV SOLN: 500.0000 mL | INTRAVENOUS | Status: DC | PRN

## 2024-07-12 MED FILL — Misoprostol Tab 200 MCG: ORAL | Qty: 4 | Status: CN

## 2024-07-12 MED FILL — Oxytocin Inj 10 Unit/ML: INTRAMUSCULAR | Qty: 2 | Status: CN

## 2024-07-12 MED FILL — Lidocaine HCl Local Preservative Free (PF) Inj 1%: INTRAMUSCULAR | Qty: 30 | Status: CN

## 2024-07-12 NOTE — OB Triage Note (Signed)
 Kendra Santos presented to the L&D department with complaints of vaginal spotting since yesterday. She was seen in the OB office on Monday and a membrane sweep was performed. Kendra Santos says she has been having some cramps since last night. She says they started around 2 hours apart last night and are now they're 30 minutes, reports feeling less fetal movements than usual. This is her third pregnancy, both previous pregnancies were vaginal deliveries. G1 she had preeclampsia/ G2 no issues. She reports she takes a baby aspirin as a preventative. Vital signs WNL. EFM applied.

## 2024-07-12 NOTE — H&P (Signed)
 OB History & Physical   History of Present Illness:  Chief Complaint:   HPI:  Kendra Santos is a 33 y.o. [id number] female at [redacted]w[redacted]d dated by LMP.  She presents to L&D for active labor.  She reports:  -active fetal movement -no leakage of fluid -no vaginal bleeding -onset of contractions at 0700 currently every 5-6 minutes  Pregnancy Issues: Obesity BMI > 35 and < 40:  History of Preeclampsia with SF History Of Premature Delivery due to pPROM and Pre-E H/o Depression    Maternal Medical History:   Past Medical History:  Diagnosis Date   Breast abscess    Hypertension     Past Surgical History:  Procedure Laterality Date   CHOLECYSTECTOMY N/A 04/14/2019   Procedure: LAPAROSCOPIC CHOLECYSTECTOMY;  Surgeon: Marolyn Nest, MD;  Location: [facility] ORS;  Service: General;  Laterality: N/A;   sebaceous cyst excision  2015   Back    Allergies[1]  Prior to Admission medications  Medication Sig Start Date End Date Taking? Authorizing Provider  aspirin EC 81 MG tablet Take 81 mg by mouth daily. Swallow whole.    [provider]  brompheniramine-pseudoephedrine-DM 30-2-10 MG/5ML syrup Take 5 mLs by mouth 4 (four) times daily as needed. Patient not taking: Reported on 04/18/2024 09/06/23   Janit Kast, PA-C  Prenatal Vit-Fe Fumarate-FA (MULTIVITAMIN-PRENATAL) 27-0.8 MG TABS tablet Take 1 tablet by mouth daily at 12 noon.    [provider]  Prenatal Vit-Fe Fumarate-FA (PRENATAL VITAMINS) 28-0.8 MG TABS Take 1 tablet by mouth daily. 06/20/24   [provider]     Prenatal care site: [facility]   Social History: She  reports that she has never smoked. She has never been exposed to tobacco smoke. She has never used smokeless tobacco. She reports that she does not currently use alcohol. She reports that she does not use drugs.  Family History: family history includes Asthma in her brother; Diabetes in her mother; Hypertension in her mother.    Review of Systems: A full review of systems was performed and negative except as noted in the HPI.    Physical Exam:  Vital Signs: BP 119/79   Pulse 82   Temp 98.3 F (36.8 C) (Oral)   Resp 16   Ht 5' 1 (1.549 m)   Wt 84.4 kg   LMP 10/19/2023 (Exact Date)   BMI 35.14 kg/m   General:   alert and cooperative  Skin:  normal  Neurologic:    Alert & oriented x 3  Lungs:   Nl effort  Heart:   regular rate and rhythm  Abdomen:  normal findings: soft, non-tender  Extremities: : non-tender, symmetric, No edema bilaterally.      EFW: 06/29/24   3,186   g       79%         7 lb 0  oz   No results found for this or any previous visit (from the past 24 hours).  Pertinent Results:  Prenatal Labs: Blood type/Rh O pos  Antibody screen neg  Rubella Immune  Varicella Immune  RPR NR  HBsAg Neg  HIV NR  GC neg  Chlamydia neg  Genetic screening negative  1 hour GTT 131  3 hour GTT   GBS Neg   FHT: FHR: 135 bpm, variability: moderate,  accelerations:  Present,  decelerations:  Absent Category/reactivity:  Category I TOCO: regular, every 5-6 minutes SVE: Dilation: 4.5 / Effacement (%): 80 / Station: -2  Assessment:  Kendra Santos is a 33 y.o. [phone] female at [redacted]w[redacted]d with active labor.   Plan:  1. Admit to Labor & Delivery; consents reviewed and obtained  2. Fetal Well being  - Fetal Tracing: Cat I - GBS neg - Presentation: vtx confirmed by sve   3. Routine OB: - Prenatal labs reviewed, as above - Rh pos - CBC & T&S on admit - Clear fluids, IVF  4. Monitoring of Labor -  Contractions by external toco in place -  Pelvis proven to 3515g -  Plan for continuous fetal monitoring  -  Maternal pain control as desired: IVPM, nitrous, regional anesthesia - Anticipate vaginal delivery  5. Post Partum Planning: - Infant feeding: Breastfeeding - Contraception: BTL - Tdap: Given 05/02/24  - Flu: Given 05/16/24  - RSV: Given 06/02/24   DELON COE, CNM 07/12/2024  8:53 PM       [1] No Known Allergies

## 2024-07-13 ENCOUNTER — Encounter: Payer: Self-pay | Admitting: Obstetrics and Gynecology

## 2024-07-13 ENCOUNTER — Inpatient Hospital Stay: Admitting: Anesthesiology

## 2024-07-13 LAB — SYPHILIS: RPR W/REFLEX TO RPR TITER AND TREPONEMAL ANTIBODIES, TRADITIONAL SCREENING AND DIAGNOSIS ALGORITHM: RPR Ser Ql: NONREACTIVE

## 2024-07-13 LAB — CBC
HCT: 37.4 % (ref 36.0–46.0)
Hemoglobin: 12.9 g/dL (ref 12.0–15.0)
MCH: 31.4 pg (ref 26.0–34.0)
MCHC: 34.5 g/dL (ref 30.0–36.0)
MCV: 91 fL (ref 80.0–100.0)
Platelets: 266 K/uL (ref 150–400)
RBC: 4.11 MIL/uL (ref 3.87–5.11)
RDW: 13.5 % (ref 11.5–15.5)
WBC: 21.2 K/uL — ABNORMAL HIGH (ref 4.0–10.5)
nRBC: 0 % (ref 0.0–0.2)

## 2024-07-13 MED ORDER — OXYCODONE HCL 5 MG PO TABS: 5.0000 mg | ORAL_TABLET | ORAL | Status: DC | PRN

## 2024-07-13 MED ORDER — SENNOSIDES-DOCUSATE SODIUM 8.6-50 MG PO TABS
2.0000 | ORAL_TABLET | Freq: Every day | ORAL | Status: DC
  Administered 2024-07-14: 2 via ORAL
  Filled 2024-07-13: qty 2

## 2024-07-13 MED ORDER — COCONUT OIL OIL: 1.0000 | TOPICAL_OIL | Status: DC | PRN

## 2024-07-13 MED ORDER — ONDANSETRON HCL 4 MG PO TABS: 4.0000 mg | ORAL_TABLET | ORAL | Status: DC | PRN

## 2024-07-13 MED ORDER — PRENATAL MULTIVITAMIN CH
1.0000 | ORAL_TABLET | Freq: Every day | ORAL | Status: DC
  Administered 2024-07-13: 11:00:00 1 via ORAL
  Filled 2024-07-13 (×2): qty 1

## 2024-07-13 MED ORDER — BENZOCAINE-MENTHOL 20-0.5 % EX AERO: 1.0000 | INHALATION_SPRAY | CUTANEOUS | Status: DC | PRN

## 2024-07-13 MED ORDER — OXYTOCIN-SODIUM CHLORIDE 30-0.9 UT/500ML-% IV SOLN
1.0000 m[IU]/min | INTRAVENOUS | Status: DC
  Administered 2024-07-13: 02:00:00 2 m[IU]/min via INTRAVENOUS

## 2024-07-13 MED ORDER — LIDOCAINE HCL (PF) 1 % IJ SOLN
INTRAMUSCULAR | Status: DC | PRN
  Administered 2024-07-13: 03:00:00 3 mL

## 2024-07-13 MED ORDER — ONDANSETRON HCL 4 MG/2ML IJ SOLN: 4.0000 mg | INTRAMUSCULAR | Status: DC | PRN

## 2024-07-13 MED ORDER — TETANUS-DIPHTH-ACELL PERTUSSIS 5-2-15.5 LF-MCG/0.5 IM SUSP
0.5000 mL | Freq: Once | INTRAMUSCULAR | Status: DC
  Filled 2024-07-13: qty 0.5

## 2024-07-13 MED ORDER — PHENYLEPHRINE 80 MCG/ML (10ML) SYRINGE FOR IV PUSH (FOR BLOOD PRESSURE SUPPORT): 80.0000 ug | PREFILLED_SYRINGE | INTRAVENOUS | Status: DC | PRN

## 2024-07-13 MED ORDER — FENTANYL-BUPIVACAINE-NACL 0.5-0.125-0.9 MG/250ML-% EP SOLN
12.0000 mL/h | EPIDURAL | Status: DC | PRN
  Filled 2024-07-13: qty 250

## 2024-07-13 MED ORDER — DIBUCAINE (PERIANAL) 1 % EX OINT: 1.0000 | TOPICAL_OINTMENT | CUTANEOUS | Status: DC | PRN

## 2024-07-13 MED ORDER — SIMETHICONE 80 MG PO CHEW: 80.0000 mg | CHEWABLE_TABLET | ORAL | Status: DC | PRN

## 2024-07-13 MED ORDER — WITCH HAZEL-GLYCERIN EX PADS: 1.0000 | MEDICATED_PAD | CUTANEOUS | Status: DC | PRN

## 2024-07-13 MED ORDER — ACETAMINOPHEN 325 MG PO TABS
650.0000 mg | ORAL_TABLET | ORAL | Status: DC | PRN
  Administered 2024-07-13: 09:00:00 650 mg via ORAL
  Filled 2024-07-13: qty 2

## 2024-07-13 MED ORDER — EPHEDRINE 5 MG/ML INJ: 10.0000 mg | INTRAVENOUS | Status: DC | PRN

## 2024-07-13 MED ORDER — IBUPROFEN 600 MG PO TABS
600.0000 mg | ORAL_TABLET | Freq: Four times a day (QID) | ORAL | Status: DC
  Administered 2024-07-13 (×4): 600 mg via ORAL
  Filled 2024-07-13 (×5): qty 1

## 2024-07-13 MED ORDER — LACTATED RINGERS IV SOLN: 500.0000 mL | Freq: Once | INTRAVENOUS | Status: DC

## 2024-07-13 MED ORDER — DIPHENHYDRAMINE HCL 50 MG/ML IJ SOLN: 12.5000 mg | INTRAMUSCULAR | Status: DC | PRN

## 2024-07-13 MED ORDER — DIPHENHYDRAMINE HCL 25 MG PO CAPS: 25.0000 mg | ORAL_CAPSULE | Freq: Four times a day (QID) | ORAL | Status: DC | PRN

## 2024-07-13 MED ORDER — OXYCODONE HCL 5 MG PO TABS: 10.0000 mg | ORAL_TABLET | ORAL | Status: DC | PRN

## 2024-07-13 NOTE — Anesthesia Preprocedure Evaluation (Signed)
 Anesthesia Evaluation  Patient identified by MRN, date of birth, ID band Patient awake    Reviewed: Allergy & Precautions, NPO status , Patient's Chart, lab work & pertinent test results  History of Anesthesia Complications Negative for: history of anesthetic complications  Airway Mallampati: III  TM Distance: >3 FB Neck ROM: full    Dental no notable dental hx.    Pulmonary neg pulmonary ROS   Pulmonary exam normal        Cardiovascular Exercise Tolerance: Good negative cardio ROS Normal cardiovascular exam     Neuro/Psych    GI/Hepatic negative GI ROS,,,  Endo/Other    Renal/GU   negative genitourinary   Musculoskeletal   Abdominal   Peds  Hematology negative hematology ROS (+)   Anesthesia Other Findings Past Medical History: No date: Breast abscess No date: Hypertension  Past Surgical History: 04/14/2019: CHOLECYSTECTOMY; N/A     Comment:  Procedure: LAPAROSCOPIC CHOLECYSTECTOMY;  Surgeon:               Marolyn Nest, MD;  Location: [facility] ORS;  Service:               General;  Laterality: N/A; 2015: sebaceous cyst excision     Comment:  Back  BMI    Body Mass Index: 35.14 kg/m      Reproductive/Obstetrics (+) Pregnancy                              Anesthesia Physical Anesthesia Plan  ASA: 2  Anesthesia Plan: Epidural   Post-op Pain Management:    Induction:   PONV Risk Score and Plan:   Airway Management Planned: Natural Airway  Additional Equipment:   Intra-op Plan:   Post-operative Plan:   Informed Consent: I have reviewed the patients History and Physical, chart, labs and discussed the procedure including the risks, benefits and alternatives for the proposed anesthesia with the patient or authorized representative who has indicated his/her understanding and acceptance.     Dental Advisory Given  Plan Discussed with: Anesthesiologist  Anesthesia  Plan Comments: (Patient reports no bleeding problems and no anticoagulant use.   Patient consented for risks of anesthesia including but not limited to:  - adverse reactions to medications - risk of bleeding, infection and or nerve damage from epidural that could lead to paralysis - risk of headache or failed epidural - nerve damage due to positioning - that if epidural is used for C-section that there is a chance of epidural failure requiring spinal placement or conversion to GA - Damage to heart, brain, lungs, other parts of body or loss of life  Patient voiced understanding and assent.)         Anesthesia Quick Evaluation

## 2024-07-13 NOTE — Anesthesia Procedure Notes (Signed)
 Epidural Patient location during procedure: OB Start time: 07/13/2024 2:58 AM End time: 07/13/2024 3:00 AM  Staffing Anesthesiologist: Chesley Lendia CROME, MD Performed: anesthesiologist   Preanesthetic Checklist Completed: patient identified, IV checked, site marked, risks and benefits discussed, surgical consent, monitors and equipment checked, pre-op evaluation and timeout performed  Epidural Patient position: sitting Prep: ChloraPrep Patient monitoring: heart rate, continuous pulse ox and blood pressure Approach: midline Location: L3-L4 Injection technique: LOR saline  Needle:  Needle type: Tuohy  Needle gauge: 17 G Needle length: 9 cm and 9  Assessment Sensory level: T10 Events: blood not aspirated, injection not painful, no injection resistance, no paresthesia and negative IV test  Additional Notes 1 attempt Pt. Evaluated and documentation done after procedure finished. Patient identified. Risks/Benefits/Options discussed with patient including but not limited to bleeding, infection, nerve damage, paralysis, failed block, incomplete pain control, headache, blood pressure changes, nausea, vomiting, reactions to medication both or allergic, itching and postpartum back pain. Confirmed with bedside nurse the patient's most recent platelet count. Confirmed with patient that they are not currently taking any anticoagulation, have any bleeding history or any family history of bleeding disorders. Patient expressed understanding and wished to proceed. All questions were answered. Sterile technique was used throughout the entire procedure. Please see nursing notes for vital signs.  Procedure aborted due to  decreased fetal heart tones and inability to position properly. Reason for block:procedure for pain

## 2024-07-13 NOTE — Progress Notes (Signed)
 Labor Progress Note  Kendra Santos is a 33 y.o. [phone] at [redacted]w[redacted]d by LMP admitted for active labor  Subjective: Pt is coping with her really strong contractions  Objective: BP 119/79   Pulse 82   Temp 98.3 F (36.8 C) (Oral)   Resp 16   Ht 5' 1 (1.549 m)   Wt 84.4 kg   LMP 10/19/2023 (Exact Date)   BMI 35.14 kg/m   Fetal Assessment: FHT:  FHR: 135 bpm, variability: moderate,  accelerations:  Present,  decelerations:  Absent Category/reactivity:  Category I UC:   regular, every 1-4 minutes SVE:    Dilation: 7cm  Effacement: 90%  Station:  -2  Consistency: soft  Position: posterior  Membrane status: AROM at 0056 Amniotic color: clear   Labs: Lab Results  Component Value Date   WBC 11.7 (H) 07/12/2024   HGB 12.9 07/12/2024   HCT 36.5 07/12/2024   MCV 88.6 07/12/2024   PLT 281 07/12/2024    Assessment / Plan: Spontaneous labor, progressing normally  Labor: Progressing normally Preeclampsia:  119/79 Fetal Wellbeing:  Category I Pain Control:  Labor support without medications I/D:  Afebrile, GBS neg, AROM Anticipated MOD:  NSVD  [name], CNM 07/13/2024, 12:59 AM

## 2024-07-13 NOTE — Lactation Note (Signed)
 This note was copied from a baby's chart. Lactation Consultation Note  Patient Name: Kendra Santos Unijb'd Date: 07/13/2024 Age:32 hours Reason for consult: Initial assessment;Early term 37-38.6wks  Mom called LC to room to assist with latch.  Maternal Data Has patient been taught Hand Expression?: Yes Does the patient have breastfeeding experience prior to this delivery?: Yes  Feeding Mother's Current Feeding Choice: Breast Milk and Formula Nipple Type: Slow - flow  LATCH Score Latch: Grasps breast easily, tongue down, lips flanged, rhythmical sucking.  Audible Swallowing: Spontaneous and intermittent  Type of Nipple: Everted at rest and after stimulation  Comfort (Breast/Nipple): Soft / non-tender  Hold (Positioning): Assistance needed to correctly position infant at breast and maintain latch.  LATCH Score: 9   Lactation Tools Discussed/Used  Attempted latch in cradle hold on right side with and without 24mm nipple shield for 15 min. Per mom's request, shifted to football hold on left side and baby latched easily with flanged lips, rhythmic sucking, and numerous audible swallows. Detached independently after 15 min and nipple was round. Back to right side in football hold where baby latched another 5 min.   Interventions Interventions: Breast feeding basics reviewed;Assisted with latch;Adjust position;Support pillows;Position options;Education;Infant Driven Feeding Algorithm education;LC Services brochure;CDC milk storage guidelines;CDC Guidelines for Breast Pump Cleaning  Discharge Discharge Education: Engorgement and breast care;Warning signs for feeding baby Pump: Personal (wearable)  Consult Status Consult Status: PRN    Katheryn DELENA Canton 07/13/2024, 2:31 PM

## 2024-07-13 NOTE — Discharge Summary (Signed)
 Postpartum Discharge Summary  Patient Name: Kendra Santos DOB: Jan 08, 1991 MRN: [id number]  Date of admission: 07/12/2024 Delivery date:07/13/2024 Delivering provider: MYRON NEST Date of discharge: 07/14/2024  Primary OB: [facility] OFE:Ejupzwu'd last menstrual period was 10/19/2023 (exact date). EDC Estimated Date of Delivery: 07/25/24 Gestational Age at Delivery: [redacted]w[redacted]d   Admitting diagnosis: Spotting affecting pregnancy in third trimester [O26.853] Normal labor [O80, Z37.9] Intrauterine pregnancy: [redacted]w[redacted]d     Secondary diagnosis:   Principal Problem:   NSVD (normal spontaneous vaginal delivery) Active Problems:   Spotting affecting pregnancy in third trimester   Normal labor   Discharge Diagnosis: Term Pregnancy Delivered      Hospital course: Onset of Labor With Vaginal Delivery      33 y.o. yo [id number] at [redacted]w[redacted]d was admitted in Active Labor on 07/12/2024. Labor course was complicated by none  Membrane Rupture Time/Date: 12:56 AM,07/13/2024  Delivery Method:Vaginal, Spontaneous Operative Delivery:N/A Episiotomy: None Lacerations:  None Patient had a postpartum course complicated by none.  She is ambulating, tolerating a regular diet, passing flatus, and urinating well. Patient is discharged home in stable condition on 07/14/2024.  Newborn Data: Birth date:07/13/2024 Birth time:3:31 AM Gender:Female Living status:Living Apgars:9 ,9  Weight:3290 g                                            Post partum procedures:none Augmentation:: AROM and Pitocin  Complications: None Delivery Type: spontaneous vaginal delivery Anesthesia: non-pharmacological methods Placenta: spontaneous To Pathology: No   Prenatal Labs:  Blood type/Rh O pos  Antibody screen neg  Rubella Immune  Varicella Immune  RPR NR  HBsAg Neg  HIV NR  GC neg  Chlamydia neg  Genetic screening negative  1 hour GTT 131  3 hour GTT    GBS Neg    Magnesium Sulfate received: No BMZ  received: No Rhophylac:was not indicated MMR: was not indicated Varivax vaccine given: was not indicated T-DaP:Given prenatally Flu: Given prenatally  Transfusion:No  Physical exam  Vitals:   07/13/24 1608 07/13/24 1922 07/13/24 2301 07/14/24 0731  BP: 116/72 122/81 111/67 115/68  Pulse: 68 76 68 79  Resp: 18 18 18 20   Temp: 98.7 F (37.1 C) 98.6 F (37 C) 98 F (36.7 C)   TempSrc: Oral Oral Oral   SpO2: 99% 99% 100% 99%  Weight:      Height:       General: alert, cooperative, and no distress Lochia: appropriate Uterine Fundus: firm Perineum:minimal edema/intact Incision: N/A DVT Evaluation: No evidence of DVT seen on physical exam.  Labs: Lab Results  Component Value Date   WBC 21.2 (H) 07/13/2024   HGB 12.9 07/13/2024   HCT 37.4 07/13/2024   MCV 91.0 07/13/2024   PLT 266 07/13/2024      Latest Ref Rng & Units 01/12/2024   12:49 PM  CMP  Glucose 70 - 99 mg/dL 90   BUN 6 - 20 mg/dL 6   Creatinine 9.55 - 8.99 mg/dL 9.59   Sodium 864 - 854 mmol/L 134   Potassium 3.5 - 5.1 mmol/L 3.7   Chloride 98 - 111 mmol/L 105   CO2 22 - 32 mmol/L 22   Calcium  8.9 - 10.3 mg/dL 8.5    Edinburgh Score:    07/13/2024    5:50 AM  Edinburgh Postnatal Depression Scale Screening Tool  I have been able to laugh and see  the funny side of things. 0  I have looked forward with enjoyment to things. 1  I have blamed myself unnecessarily when things went wrong. 0  I have been anxious or worried for no good reason. 0  I have felt scared or panicky for no good reason. 0  Things have been getting on top of me. 0  I have been so unhappy that I have had difficulty sleeping. 0  I have felt sad or miserable. 0  I have been so unhappy that I have been crying. 0  The thought of harming myself has occurred to me. 0  Edinburgh Postnatal Depression Scale Total 1    Risk assessment for postpartum VTE and prophylactic treatment: Very high risk factors: None High risk factors:  None Moderate risk factors: None  Postpartum VTE prophylaxis with LMWH not indicated  After visit meds:  Allergies as of 07/14/2024   No Known Allergies      Medication List     STOP taking these medications    aspirin EC 81 MG tablet   brompheniramine-pseudoephedrine-DM 30-2-10 MG/5ML syrup       TAKE these medications    acetaminophen  325 MG tablet Commonly known as: Tylenol  Take 2 tablets (650 mg total) by mouth every 6 (six) hours as needed (for pain scale < 4).   ibuprofen  600 MG tablet Commonly known as: ADVIL  Take 1 tablet (600 mg total) by mouth every 6 (six) hours as needed.   multivitamin-prenatal 27-0.8 MG Tabs tablet Take 1 tablet by mouth daily at 12 noon. What changed: Another medication with the same name was removed. Continue taking this medication, and follow the directions you see here.       Discharge home in stable condition Infant Feeding: Both Infant Disposition:home with mother Discharge instruction: per After Visit Summary and Postpartum booklet. Activity: Advance as tolerated. Pelvic rest for 6 weeks.  Diet: routine diet Anticipated Birth Control: BTL done PP   Postpartum Appointment:6 weeks Additional Postpartum F/U: Postpartum Depression checkup Future Appointments:No future appointments. Follow up Visit:  Follow-up Information     Myron Nest, CNM. Schedule an appointment as soon as possible for a visit in 1 week(s).   Specialty: Certified Nurse Midwife Why: for a mood check Contact information: [address] [place] [address] [phone]         Myron Nest, CNM. Schedule an appointment as soon as possible for a visit in 6 week(s).   Specialty: Certified Nurse Midwife Contact information: [address] [place] [address] [phone]                 Plan:  Ahrianna Siglin was discharged to home in good condition. Follow-up appointment as directed.    Signed: Jenifer E  Ketra Duchesne 07/14/2024 9:40 AM

## 2024-07-13 NOTE — Progress Notes (Signed)
 Post Partum Day 0 Subjective: Doing well, no complaints.  Tolerating regular diet, pain with PO meds, voiding and ambulating without difficulty.  No CP SOB Fever,Chills, N/V or leg pain; denies nipple or breast pain, no HA change of vision, RUQ/epigastric pain  Objective: BP 117/64 (BP Location: Left Arm)   Pulse 80   Temp 98.8 F (37.1 C) (Oral)   Resp 18   Ht 5' 1 (1.549 m)   Wt 84.4 kg   LMP 10/19/2023 (Exact Date)   SpO2 99%   Breastfeeding Unknown   BMI 35.14 kg/m    Physical Exam:  General: NAD Breasts: soft/nontender CV: RRR Pulm: nl effort, CTABL Abdomen: soft, NT, BS x 4 Perineum:  minimal edema, intact Lochia: moderate Uterine Fundus: fundus firm and 1 fb below umbilicus DVT Evaluation: no cords, ttp LEs   Recent Labs    07/12/24 2101 07/13/24 0700  HGB 12.9 12.9  HCT 36.5 37.4  WBC 11.7* 21.2*  PLT 281 266    Assessment/Plan: 33 y.o. [id number] postpartum day # 0  - Continue routine PP care - Lactation consult PRN - Discussed contraceptive options including implant, IUDs hormonal and non-hormonal, injection, pills/ring/patch, condoms, and NFP.  - No anemia present - Immunization status: all Imms up to date  Disposition: Does not desire Dc home today.   Kendra Santos, CNM 07/13/2024 12:42 PM

## 2024-07-14 MED ORDER — IBUPROFEN 600 MG PO TABS: 600.0000 mg | ORAL_TABLET | Freq: Four times a day (QID) | ORAL | Status: DC | PRN

## 2024-07-14 MED ORDER — ACETAMINOPHEN 325 MG PO TABS: 650.0000 mg | ORAL_TABLET | Freq: Four times a day (QID) | ORAL | Status: DC | PRN

## 2024-07-14 NOTE — Anesthesia Postprocedure Evaluation (Signed)
"   Anesthesia Post Note  Patient: Kendra Santos  Procedure(s) Performed: AN AD HOC LABOR EPIDURAL  Patient location during evaluation: Mother Baby Anesthesia Type: Epidural Level of consciousness: awake and alert Pain management: pain level controlled Vital Signs Assessment: post-procedure vital signs reviewed and stable Respiratory status: spontaneous breathing, nonlabored ventilation and respiratory function stable Cardiovascular status: stable Postop Assessment: no headache, no backache and epidural receding Anesthetic complications: no   No notable events documented.   Last Vitals:  Vitals:   07/13/24 1922 07/13/24 2301  BP: 122/81 111/67  Pulse: 76 68  Resp: 18 18  Temp: 37 C 36.7 C  SpO2: 99% 100%    Last Pain:  Vitals:   07/14/24 0300  TempSrc:   PainSc: Asleep                 Jaylene Delon HERO      "

## 2024-07-14 NOTE — Progress Notes (Signed)
 Pt discharged with infant.  Discharge instructions, prescriptions and follow up appointment given to and reviewed with pt. Pt verbalized understanding. Escorted out by auxillary.

## 2024-08-24 NOTE — H&P (Signed)
 " [name] is a 34 y.o. female here for Discuss bilateral tubal ligation . History of Present Illness Kendra Santos is a 34 year old female who presents for sterilization consultation.   She is interested in sterilization through the removal of each fallopian tube. She has been pregnant three times and has three children. She has [employer] and has filled out the [facility] consent form during her last pregnancy.   Her past surgical history includes a cholecystectomy performed approximately five to six years ago. She denies any history of pelvic infections or ruptured appendix. She reports a history of cholecystectomy (gallbladder removal) performed approximately five to six years ago. No problems have been reported since her gallbladder removal.     Past Medical History:  has a past medical history of Breast mass, Depression, H/O pre-eclampsia (2014), and Obesity.  Past Surgical History:  has a past surgical history that includes Cholecystectomy. Family History: family history includes Diabetes in her mother. Social History:  reports that she has never smoked. She has never been exposed to tobacco smoke. She has never used smokeless tobacco. She reports that she does not drink alcohol and does not use drugs. OB/GYN History:  OB History       Gravida  3   Para  3   Term  2   Preterm  1   AB      Living  3        SAB      IAB      Ectopic      Molar      Multiple      Live Births  3             Allergies: has no known allergies. Medications:  Current Medications    Current Outpatient Medications:    prenatal vit-iron fum-folic ac (PRENAVITE) tablet, Take 1 tablet by mouth once daily, Disp: 30 tablet, Rfl: 6   ASPIRIN ORAL, Take 81 mg by mouth (Patient not taking: Reported on 08/14/2024), Disp: , Rfl:      Review of Systems: General:                      No fatigue or weight loss Eyes:                           No vision changes Ears:                             No hearing difficulty Respiratory:                No cough or shortness of breath Pulmonary:                  No asthma or shortness of breath Cardiovascular:           No chest pain, palpitations, dyspnea on exertion Gastrointestinal:          No abdominal bloating, chronic diarrhea, constipations, masses, pain or hematochezia Genitourinary:             No hematuria, dysuria, abnormal vaginal discharge, pelvic pain, Menometrorrhagia Lymphatic:                   No swollen lymph nodes Musculoskeletal:No muscle weakness Neurologic:                  No extremity  weakness, syncope, seizure disorder Psychiatric:                  No history of depression, delusions or suicidal/homicidal ideation      Exam:       Vitals:    08/14/24 1548  BP: 119/81  Pulse: 76      Body mass index is 30 kg/m.   WDWN  female in NAD   Lungs: CTA  CV : RRR without murmur     Neck:  no thyromegaly Abdomen: soft , no mass, normal active bowel sounds,  non-tender, no rebound tenderness Pelvic: tanner stage 5 ,  External genitalia: vulva /labia no lesions Urethra: no prolapse Vagina: normal physiologic d/c Cervix: no lesions, no cervical motion tenderness   Uterus: normal size shape and contour, non-tender Adnexa: no mass,  non-tender   Rectovaginal: no mass heme negative   Impression:    The encounter diagnosis was Consultation for female sterilization.       Plan:    Assessment & Plan Spoke to her regarding the l/s sterilization procedure        Sterilization planning Interested in permanent sterilization via bilateral salpingectomy. Medicaid insurance with consent form completed. No contraindications noted. - Perform pelvic exam to ensure normal findings. -            No follow-ups on file.   DEBBY CLARYCE DINSMORE, MD   "

## 2024-09-01 ENCOUNTER — Inpatient Hospital Stay: Admission: RE | Admit: 2024-09-01 | Source: Ambulatory Visit

## 2024-09-01 ENCOUNTER — Other Ambulatory Visit: Payer: Self-pay

## 2024-09-01 NOTE — Patient Instructions (Addendum)
 Your procedure is scheduled on: 02/13 /26 -  Friday Report to the Registration Desk on the 1st floor of the [facility]. To find out your arrival time, please call [phone] between 1PM - 3PM on: 09/07/24 - Thursday If your arrival time is 6:00 am, do not arrive before that time as the [facility] entrance doors do not open until 6:00 am.  REMEMBER: Instructions that are not followed completely may result in serious medical risk, up to and including death; or upon the discretion of your surgeon and anesthesiologist your surgery may need to be rescheduled.  Do not eat food after midnight the night before surgery.  No gum chewing or hard candies.  You may however, drink CLEAR liquids up to 2 hours before you are scheduled to arrive for your surgery. Do not drink anything within 2 hours of your scheduled arrival time.  Clear liquids include: - water  - apple juice without pulp - gatorade (not RED colors) - black coffee or tea (Do NOT add milk or creamers to the coffee or tea) Do NOT drink anything that is not on this list.  In addition, your doctor has ordered for you to drink the provided:  Ensure Pre-Surgery Clear Carbohydrate Drink  Drinking this carbohydrate drink up to two hours before surgery helps to reduce insulin resistance and improve patient outcomes. Please complete drinking 2 hours before scheduled arrival time.  One week prior to surgery: Stop Anti-inflammatories (NSAIDS) such as Advil , Aleve, Ibuprofen , Motrin , Naproxen, Naprosyn and Aspirin based products such as Excedrin, Goody's Powder, BC Powder. You may take Tylenol  if needed for pain up until the day of surgery.  Stop ANY OVER THE COUNTER supplements until after surgery.  ON THE DAY OF SURGERY ONLY TAKE THESE MEDICATIONS WITH SIPS OF WATER:  none   No Alcohol for 24 hours before or after surgery.  No Smoking including e-cigarettes for 24 hours before surgery.  No chewable tobacco products for at least 6  hours before surgery.  No nicotine patches on the day of surgery.  Do not use any recreational drugs for at least a week (preferably 2 weeks) before your surgery.  Please be advised that the combination of cocaine and anesthesia may have negative outcomes, up to and including death. If you test positive for cocaine, your surgery will be cancelled.  On the morning of surgery brush your teeth with toothpaste and water, you may rinse your mouth with mouthwash if you wish. Do not swallow any toothpaste or mouthwash.  Use CHG Soap or wipes as directed on instruction sheet.  Do not wear jewelry, make-up, hairpins, clips or nail polish.  For welded (permanent) jewelry: bracelets, anklets, waist bands, etc.  Please have this removed prior to surgery.  If it is not removed, there is a chance that hospital personnel will need to cut it off on the day of surgery.  Do not wear lotions, powders, or perfumes.   Do not shave body hair from the neck down 48 hours before surgery.  Contact lenses, hearing aids and dentures may not be worn into surgery.  Do not bring valuables to the hospital. [facility] is not responsible for any missing/lost belongings or valuables.   Notify your doctor if there is any change in your medical condition (cold, fever, infection).  Wear comfortable clothing (specific to your surgery type) to the hospital.  After surgery, you can help prevent lung complications by doing breathing exercises.  Take deep breaths and cough every  1-2 hours. Your doctor may order a device called an Incentive Spirometer to help you take deep breaths.  When coughing or sneezing, hold a pillow firmly against your incision with both hands. This is called splinting. Doing this helps protect your incision. It also decreases belly discomfort.  If you are being admitted to the hospital overnight, leave your suitcase in the car. After surgery it may be brought to your room.  In case of increased  patient census, it may be necessary for you, the patient, to continue your postoperative care in the Same Day Surgery department.  If you are being discharged the day of surgery, you will not be allowed to drive home. You will need a responsible individual to drive you home and stay with you for 24 hours after surgery.   If you are taking public transportation, you will need to have a responsible individual with you.  Please call the Pre-admissions Testing Dept. at [phone] if you have any questions about these instructions.  Surgery Visitation Policy:  Patients having surgery or a procedure may have two visitors.  Children under the age of 60 must have an adult with them who is not the patient.  Inpatient Visitation:    Visiting hours are 7 a.m. to 8 p.m. Up to four visitors are allowed at one time in a patient room. The visitors may rotate out with other people during the day.  One visitor age 74 or older may stay with the patient overnight and must be in the room by 8 p.m.   Merchandiser, Retail to address health-related social needs:  [email or web address]                                                                                                            Preparing for Surgery with CHLORHEXIDINE  GLUCONATE (CHG) Soap  Chlorhexidine  Gluconate (CHG) Soap  o An antiseptic cleaner that kills germs and bonds with the skin to continue killing germs even after washing  o Used for showering the night before surgery and morning of surgery  Before surgery, you can play an important role by reducing the number of germs on your skin.  CHG (Chlorhexidine  gluconate) soap is an antiseptic cleanser which kills germs and bonds with the skin to continue killing germs even after washing.  Please do not use if you have an allergy to CHG or antibacterial soaps. If your skin becomes reddened/irritated stop using the CHG.  1. Shower the NIGHT BEFORE SURGERY with CHG  soap.  2. If you choose to wash your hair, wash your hair first as usual with your normal shampoo.  3. After shampooing, rinse your hair and body thoroughly to remove the shampoo.  4. Use CHG as you would any other liquid soap. You can apply CHG directly to the skin and wash gently with a clean washcloth.  5. Apply the CHG soap to your body only from the neck down. Do not use on open wounds or open sores. Avoid contact with your eyes, ears, mouth, and genitals (private  parts). Wash face and genitals (private parts) with your normal soap.  6. Wash thoroughly, paying special attention to the area where your surgery will be performed.  7. Thoroughly rinse your body with warm water.  8. Do not shower/wash with your normal soap after using and rinsing off the CHG soap.  9. Do not use lotions, oils, etc., after showering with CHG.  10. Pat yourself dry with a clean towel.  11. Wear clean pajamas to bed the night before surgery.  12. Place clean sheets on your bed the night of your shower and do not sleep with pets.  13. Do not apply any deodorants/lotions/powders.  14. Please wear clean clothes to the hospital.  15. Remember to brush your teeth with your regular toothpaste.

## 2024-09-05 ENCOUNTER — Other Ambulatory Visit

## 2024-09-08 ENCOUNTER — Encounter: Payer: Self-pay | Admitting: Urgent Care

## 2024-09-08 ENCOUNTER — Encounter: Admission: RE | Payer: Self-pay | Source: Home / Self Care

## 2024-09-08 ENCOUNTER — Ambulatory Visit: Admission: RE | Admit: 2024-09-08 | Admitting: Obstetrics and Gynecology

## 2024-09-08 DIAGNOSIS — Z01818 Encounter for other preprocedural examination: Secondary | ICD-10-CM
